# Patient Record
Sex: Female | Born: 1965 | Race: White | Hispanic: No | Marital: Single | Smoking: Former smoker
Health system: Southern US, Community
[De-identification: ages and names within clinical notes are randomized; demographics above are authoritative.]

## PROBLEM LIST (undated history)

## (undated) DIAGNOSIS — F419 Anxiety disorder, unspecified: Secondary | ICD-10-CM

## (undated) DIAGNOSIS — M1990 Unspecified osteoarthritis, unspecified site: Secondary | ICD-10-CM

## (undated) DIAGNOSIS — G4730 Sleep apnea, unspecified: Secondary | ICD-10-CM

## (undated) DIAGNOSIS — J329 Chronic sinusitis, unspecified: Secondary | ICD-10-CM

## (undated) DIAGNOSIS — K589 Irritable bowel syndrome without diarrhea: Secondary | ICD-10-CM

## (undated) DIAGNOSIS — J45909 Unspecified asthma, uncomplicated: Secondary | ICD-10-CM

## (undated) DIAGNOSIS — E785 Hyperlipidemia, unspecified: Secondary | ICD-10-CM

## (undated) DIAGNOSIS — J309 Allergic rhinitis, unspecified: Secondary | ICD-10-CM

## (undated) DIAGNOSIS — R42 Dizziness and giddiness: Secondary | ICD-10-CM

## (undated) HISTORY — PX: LAPAROSCOPIC HYSTERECTOMY: SHX1926

## (undated) HISTORY — PX: SINOSCOPY: SHX187

## (undated) HISTORY — DX: Unspecified osteoarthritis, unspecified site: M19.90

## (undated) HISTORY — DX: Anxiety disorder, unspecified: F41.9

## (undated) HISTORY — DX: Chronic sinusitis, unspecified: J32.9

## (undated) HISTORY — DX: Hyperlipidemia, unspecified: E78.5

## (undated) HISTORY — DX: Dizziness and giddiness: R42

## (undated) HISTORY — DX: Unspecified asthma, uncomplicated: J45.909

## (undated) HISTORY — DX: Irritable bowel syndrome, unspecified: K58.9

## (undated) HISTORY — DX: Sleep apnea, unspecified: G47.30

## (undated) HISTORY — DX: Allergic rhinitis, unspecified: J30.9

---

## 2012-06-03 DEATH — deceased

## 2016-01-22 HISTORY — PX: COLONOSCOPY: SHX174

## 2016-11-04 ENCOUNTER — Other Ambulatory Visit (HOSPITAL_COMMUNITY): Payer: Self-pay | Admitting: Physician Assistant

## 2016-11-04 DIAGNOSIS — S46002A Unspecified injury of muscle(s) and tendon(s) of the rotator cuff of left shoulder, initial encounter: Secondary | ICD-10-CM

## 2017-08-01 ENCOUNTER — Ambulatory Visit (INDEPENDENT_AMBULATORY_CARE_PROVIDER_SITE_OTHER): Payer: BLUE CROSS/BLUE SHIELD | Admitting: Allergy

## 2017-08-01 ENCOUNTER — Encounter: Payer: Self-pay | Admitting: Allergy

## 2017-08-01 VITALS — BP 108/60 | HR 88 | Temp 98.2°F | Resp 18 | Ht 63.5 in | Wt 149.4 lb

## 2017-08-01 DIAGNOSIS — J4540 Moderate persistent asthma, uncomplicated: Secondary | ICD-10-CM | POA: Diagnosis not present

## 2017-08-01 DIAGNOSIS — J3089 Other allergic rhinitis: Secondary | ICD-10-CM | POA: Diagnosis not present

## 2017-08-01 MED ORDER — MONTELUKAST SODIUM 10 MG PO TABS: ORAL_TABLET | ORAL | 5 refills | Status: DC

## 2017-08-01 NOTE — Progress Notes (Signed)
New Patient Note  RE: Holly Deleon MRN: [id number] DOB: 1965-10-03 Date of Office Visit: 08/01/2017  Referring provider: Cletis Athens, MD Primary care provider: Angelina Sheriff, MD  Chief Complaint: sinus symptoms  History of present illness: Holly Deleon is a 52 y.o. female presenting today for consultation for allergies.   She states she has been having allergy/sinus symptoms for years.  She states 6 months (spring/summer/fall) out of the year symptoms are a lot worse and 2 of the 6 where she will have hoarseness.  Her typical symptoms include nasal congestion, dry cough mostly at night impacting sleep onset and ear fullness with vertigo.   She sees Dr. Gaylyn Deleon, local ENT, due to history of nasal and endoscopic sinus surgery on 11/22/2012.  She states Dr. Gaylyn Deleon told her her sinuses are currently clear.  He recommend she use a nasal steroid spray.  She is using nasacort 1 spray each nostril daily and has been on for years and does not feel it is helping her symptoms.  She is currently on loratadine and states will change yearly (between this, zyrtec and allegra).  She has not yet tried xyzal.  She also has never taken singulair.    For her vertigo she does take meclizine as needed.     She works as a Holly Deleon and states she can usually walk in a home and smell the mold if there is mold problem in the home.    She does not have a history of asthma or previous diagnosis.  However she was prescribed a proair inhaler about 6 months ago.  She states she did have an albuterol inhaler several years ago as well during an illness.  She states she has been having more tightness in her chest and having SOB.  The proair does help relieve symptoms.  She states with the humidity has been using proair almost daily.   She denies nighttime awakenings and denies any ED/UC visits for respiratory symptoms.  She has had oral and injectable steroids before but for management of sinusitis.    No history of eczema or  food allergy.    She does report developing itchy rash with use of certain soaps and detergents which she knows to avoid.   Review of systems: Review of Systems  Constitutional: Negative for chills, fever and malaise/fatigue.  HENT: Positive for congestion, ear pain and sinus pain. Negative for ear discharge, hearing loss, nosebleeds, sore throat and tinnitus.   Eyes: Negative for pain, discharge and redness.  Respiratory: Positive for cough and shortness of breath. Negative for hemoptysis, sputum production and wheezing.   Cardiovascular: Negative for chest pain.  Gastrointestinal: Negative for abdominal pain, constipation, diarrhea, heartburn, nausea and vomiting.  Musculoskeletal: Negative for joint pain.  Skin: Negative for itching and rash.  Neurological: Positive for dizziness. Negative for headaches.    All other systems negative unless noted above in HPI  Past medical history: Past Medical History:  Diagnosis Date  . IBS (irritable bowel syndrome)   . Vertigo     Past surgical history: Past Surgical History:  Procedure Laterality Date  . LAPAROSCOPIC HYSTERECTOMY    . SINOSCOPY      Family history:  Family History  Problem Relation Age of Onset  . Allergic rhinitis Mother   . High Cholesterol Father   . Colon cancer Father   . Heart attack Maternal Grandfather   . Allergic rhinitis Son     Social history: Lives in a home without  carpeting with electric heating and central cooling.  No pets in the home.  No concern for water damage, mildew or roaches in the home.  She is a Holly Deleon.   Tobacco Use  . Smoking status: Current Every Day Smoker    Packs/day: 1.00    Types: Cigarettes  . Smokeless tobacco: Never Used    Medication List: Allergies as of 08/01/2017      Reactions   Penicillins Other (See Comments)   "My muscles relax" unknown      Medication List        Accurate as of 08/01/17  3:38 PM. Always use your most recent med list.            albuterol 108 (90 Base) MCG/ACT inhaler Commonly known as:  PROVENTIL HFA;VENTOLIN HFA INHALE 2 PUFF BY MOUTH 3 TIMES A DAY AS NEEDED   Biotin 1 MG Caps Take by mouth.   loratadine 10 MG tablet Commonly known as:  CLARITIN Take by mouth.   meclizine 25 MG tablet Commonly known as:  ANTIVERT TAKE 1 TABLET BY MOUTH EVERY EIGHT HOURS, AS NEEDED   NASACORT ALLERGY 24HR 55 MCG/ACT Aero nasal inhaler Generic drug:  triamcinolone Place 1 spray into the nose daily.       Known medication allergies: Allergies  Allergen Reactions  . Penicillins Other (See Comments)    "My muscles relax" unknown      Physical examination: Blood pressure 108/60, pulse 88, temperature 98.2 F (36.8 C), temperature source Oral, resp. rate 18, height 5' 3.5" (1.613 m), weight 149 lb 6.4 oz (67.8 kg).  General: Alert, interactive, in no acute distress. HEENT: PERRLA, TMs pearly gray, turbinates non-edematous without discharge, post-pharynx non erythematous. Neck: Supple without lymphadenopathy. Lungs: Clear to auscultation without wheezing, rhonchi or rales. {no increased work of breathing. CV: Normal S1, S2 without murmurs. Abdomen: Nondistended, nontender. Skin: Warm and dry, without lesions or rashes. Extremities:  No clubbing, cyanosis or edema. Neuro:   Grossly intact.  Diagnositics/Labs: Spirometry: FEV1: 2.44L 87%, FVC: 3.26L 92%, ratio consistent with nonobstructive pattern  Allergy testing: environmental allergy skin prick testing is positive to walnut tree.  Intradermal testing is borderline to mold mix 2, 3 and dog Allergy testing results were read and interpreted by provider, documented by clinical staff.   Assessment and plan:   Allergic rhinitis - environmental allergy skin testing today is positive to walnut tree and borderline to molds and dog.  Allergen avoidance measures provided.  - trial Xyzal 5mg  daily (this replaces loratadine) - for nasal congestion/drainage trial  use of Dymista 1 spray each nostril twice a day.  This is a combination nasal spray with Flonase + Astelin (nasal antihistamine).  This helps with both nasal congestion and drainage.     Hold your Nasacort while on Dymista - recommend starting singulair 10mg  daily - take at bedtime  Chest tightness/Shortness of breath - reactive airway disease - have access to albuterol inhaler 2 puffs every 4-6 hours as needed for cough/wheeze/shortness of breath/chest tightness.  May use 15-20 minutes prior to activity.   Monitor frequency of use.   - start singulair as above  Follow-up 4 months or sooner if needed  I appreciate the opportunity to take part in Holly Deleon's care. Please do not hesitate to contact me with questions.  Sincerely,   Prudy Feeler, MD Allergy/Immunology [facility]

## 2017-08-01 NOTE — Patient Instructions (Addendum)
Allergic rhinitis - environmental allergy skin testing today is positive to walnut tree and borderline to molds and dog.  Allergen avoidance measures provided.  - trial Xyzal 5mg  daily (this replaces loratadine) - for nasal congestion/drainage trial use of Dymista 1 spray each nostril twice a day.  This is a combination nasal spray with Flonase + Astelin (nasal antihistamine).  This helps with both nasal congestion and drainage.     Hold your Nasacort while on Dymista - recommend starting singulair 10mg  daily - take at bedtime  Chest tightness/Shortness of breath - reactive airway disease - have access to albuterol inhaler 2 puffs every 4-6 hours as needed for cough/wheeze/shortness of breath/chest tightness.  May use 15-20 minutes prior to activity.   Monitor frequency of use.   - start singulair as above   Follow-up 4 months or sooner if needed

## 2018-02-08 DIAGNOSIS — J329 Chronic sinusitis, unspecified: Secondary | ICD-10-CM | POA: Insufficient documentation

## 2018-02-08 DIAGNOSIS — R87619 Unspecified abnormal cytological findings in specimens from cervix uteri: Secondary | ICD-10-CM

## 2018-02-08 DIAGNOSIS — K589 Irritable bowel syndrome without diarrhea: Secondary | ICD-10-CM

## 2018-02-08 DIAGNOSIS — R42 Dizziness and giddiness: Secondary | ICD-10-CM | POA: Insufficient documentation

## 2018-02-08 HISTORY — DX: Irritable bowel syndrome, unspecified: K58.9

## 2018-02-08 HISTORY — DX: Unspecified abnormal cytological findings in specimens from cervix uteri: R87.619

## 2018-02-19 ENCOUNTER — Other Ambulatory Visit: Payer: Self-pay | Admitting: Obstetrics and Gynecology

## 2018-02-19 DIAGNOSIS — R928 Other abnormal and inconclusive findings on diagnostic imaging of breast: Secondary | ICD-10-CM

## 2018-02-23 ENCOUNTER — Ambulatory Visit
Admission: RE | Admit: 2018-02-23 | Discharge: 2018-02-23 | Disposition: A | Discharge status: Home / Self Care | Payer: No Typology Code available for payment source | Source: Ambulatory Visit | Attending: Obstetrics and Gynecology | Admitting: Obstetrics and Gynecology

## 2018-02-23 DIAGNOSIS — R928 Other abnormal and inconclusive findings on diagnostic imaging of breast: Secondary | ICD-10-CM

## 2019-01-27 ENCOUNTER — Other Ambulatory Visit: Payer: Self-pay

## 2019-01-27 ENCOUNTER — Emergency Department (HOSPITAL_COMMUNITY)
Admission: EM | Admit: 2019-01-27 | Discharge: 2019-01-27 | Disposition: A | Discharge status: Home / Self Care | Payer: No Typology Code available for payment source | Attending: Emergency Medicine | Admitting: Emergency Medicine

## 2019-01-27 ENCOUNTER — Encounter (HOSPITAL_COMMUNITY): Payer: Self-pay | Admitting: Emergency Medicine

## 2019-01-27 DIAGNOSIS — R55 Syncope and collapse: Secondary | ICD-10-CM | POA: Insufficient documentation

## 2019-01-27 DIAGNOSIS — M545 Low back pain, unspecified: Secondary | ICD-10-CM

## 2019-01-27 DIAGNOSIS — Z79899 Other long term (current) drug therapy: Secondary | ICD-10-CM | POA: Insufficient documentation

## 2019-01-27 DIAGNOSIS — F17210 Nicotine dependence, cigarettes, uncomplicated: Secondary | ICD-10-CM | POA: Diagnosis not present

## 2019-01-27 LAB — URINALYSIS, ROUTINE W REFLEX MICROSCOPIC
Bacteria, UA: NONE SEEN
Bilirubin Urine: NEGATIVE
Glucose, UA: NEGATIVE mg/dL
Ketones, ur: NEGATIVE mg/dL
Leukocytes,Ua: NEGATIVE
Nitrite: NEGATIVE
Protein, ur: NEGATIVE mg/dL
Specific Gravity, Urine: 1.008 (ref 1.005–1.030)
pH: 6 (ref 5.0–8.0)

## 2019-01-27 LAB — BASIC METABOLIC PANEL
Anion gap: 9 (ref 5–15)
BUN: 23 mg/dL — ABNORMAL HIGH (ref 6–20)
CO2: 25 mmol/L (ref 22–32)
Calcium: 9.1 mg/dL (ref 8.9–10.3)
Chloride: 104 mmol/L (ref 98–111)
Creatinine, Ser: 0.66 mg/dL (ref 0.44–1.00)
GFR calc Af Amer: >60 mL/min (ref >60)
GFR calc non Af Amer: >60 mL/min (ref >60)
Glucose, Bld: 104 mg/dL — ABNORMAL HIGH (ref 70–99)
Potassium: 4.3 mmol/L (ref 3.5–5.1)
Sodium: 138 mmol/L (ref 135–145)

## 2019-01-27 LAB — CBC
HCT: 38 % (ref 36.0–46.0)
Hemoglobin: 12.4 g/dL (ref 12.0–15.0)
MCH: 31.2 pg (ref 26.0–34.0)
MCHC: 32.6 g/dL (ref 30.0–36.0)
MCV: 95.5 fL (ref 80.0–100.0)
Platelets: 266 10*3/uL (ref 150–400)
RBC: 3.98 MIL/uL (ref 3.87–5.11)
RDW: 11.9 % (ref 11.5–15.5)
WBC: 10 10*3/uL (ref 4.0–10.5)
nRBC: 0 % (ref 0.0–0.2)

## 2019-01-27 LAB — CBG MONITORING, ED: Glucose-Capillary: 108 mg/dL — ABNORMAL HIGH (ref 70–99)

## 2019-01-27 MED ORDER — SODIUM CHLORIDE 0.9% FLUSH
3.0000 mL | Freq: Once | INTRAVENOUS | Status: AC
  Administered 2019-01-27: 3 mL via INTRAVENOUS

## 2019-01-27 MED ORDER — IBUPROFEN 800 MG PO TABS
800.0000 mg | ORAL_TABLET | Freq: Once | ORAL | Status: AC
  Administered 2019-01-27: 800 mg via ORAL
  Filled 2019-01-27: qty 1

## 2019-01-27 MED ORDER — SODIUM CHLORIDE 0.9 % IV BOLUS (SEPSIS)
1000.0000 mL | Freq: Once | INTRAVENOUS | Status: AC
  Administered 2019-01-27: 1000 mL via INTRAVENOUS

## 2019-01-27 MED ORDER — LIDOCAINE 5 % EX PTCH: 1.0000 | MEDICATED_PATCH | CUTANEOUS | 0 refills | Status: DC

## 2019-01-27 NOTE — ED Provider Notes (Signed)
TIME SEEN: 4:28 AM  CHIEF COMPLAINT: Syncope  HPI: Patient is a 54 year old female with history of IBS who presents to the emergency department syncopal event.  States that she got up from bedroom to go into the kitchen to get water and on the way back suddenly felt very lightheaded and passed out.  No preceding chest pain, shortness of breath.  No recent vomiting or diarrhea.  No bloody stools or melena.  No numbness or focal weakness.  Has had intermittent right lower back pain that will wrap around to the right lower quadrant over the past few days.  No injury to the back.  Pain improves with ibuprofen.  States she did not have severe pain that caused her to pass out.  ROS: See HPI Constitutional: no fever  Eyes: no drainage  ENT: no runny nose   Cardiovascular:  no chest pain  Resp: no SOB  GI: no vomiting GU: no dysuria Integumentary: no rash  Allergy: no hives  Musculoskeletal: no leg swelling  Neurological: no slurred speech ROS otherwise negative  PAST MEDICAL HISTORY/PAST SURGICAL HISTORY:  Past Medical History:  Diagnosis Date  . IBS (irritable bowel syndrome)   . Vertigo     MEDICATIONS:  Prior to Admission medications   Medication Sig Start Date End Date Taking? Authorizing Provider  albuterol (PROVENTIL HFA;VENTOLIN HFA) 108 (90 Base) MCG/ACT inhaler INHALE 2 PUFF BY MOUTH 3 TIMES A DAY AS NEEDED 06/14/17   [provider]  Biotin 1 MG CAPS Take by mouth.    [provider]  loratadine (CLARITIN) 10 MG tablet Take by mouth.    [provider]  meclizine (ANTIVERT) 25 MG tablet TAKE 1 TABLET BY MOUTH EVERY EIGHT HOURS, AS NEEDED 06/08/17   [provider]  montelukast (SINGULAIR) 10 MG tablet Take one tablet once daily as directed 08/01/17   Kennith Gain, MD  triamcinolone (NASACORT ALLERGY 24HR) 55 MCG/ACT AERO nasal inhaler Place 1 spray into the nose daily.    [provider]    ALLERGIES:  Allergies   Allergen Reactions  . Penicillins Other (See Comments)    "My muscles relax" unknown     SOCIAL HISTORY:  Social History   Tobacco Use  . Smoking status: Current Every Day Smoker    Packs/day: 1.00    Types: Cigarettes  . Smokeless tobacco: Never Used  Substance Use Topics  . Alcohol use: Not on file    FAMILY HISTORY: Family History  Problem Relation Age of Onset  . Allergic rhinitis Mother   . High Cholesterol Father   . Colon cancer Father   . Heart attack Maternal Grandfather   . Allergic rhinitis Son     EXAM: BP 118/88 (BP Location: Right Arm)   Pulse 82   Temp 98.9 F (37.2 C) (Oral)   Resp 17   Ht 5\' 3"  (1.6 m)   Wt 62.1 kg   SpO2 98%   BMI 24.27 kg/m  CONSTITUTIONAL: Alert and oriented and responds appropriately to questions. Well-appearing; well-nourished HEAD: Normocephalic, atraumatic EYES: Conjunctivae clear, pupils appear equal, EOM appear intact ENT: normal nose; moist mucous membranes NECK: Supple, normal ROM, no midline spinal tenderness or step-off or deformity CARD: RRR; S1 and S2 appreciated; no murmurs, no clicks, no rubs, no gallops RESP: Normal chest excursion without splinting or tachypnea; breath sounds clear and equal bilaterally; no wheezes, no rhonchi, no rales, no hypoxia or respiratory distress, speaking full sentences ABD/GI: Normal bowel sounds; non-distended; soft, non-tender,  no rebound, no guarding, no peritoneal signs, no hepatosplenomegaly, no tenderness at McBurney's point, no pelvic tenderness BACK:  The back appears normal, mildly tender over the right lumbar paraspinal muscles, no midline spinal tenderness or step-off or deformity, no redness or warmth, no ecchymosis or swelling EXT: Normal ROM in all joints; no deformity noted, no edema; no cyanosis SKIN: Normal color for age and race; warm; no rash on exposed skin NEURO: Moves all extremities equally, normal speech, normal sensation diffusely PSYCH: The patient's mood  and manner are appropriate.   MEDICAL DECISION MAKING: Patient here after syncopal event.  States she had just gotten up to go to the kitchen to get water and on the way back felt lightheaded and passed out.  No other preceding symptoms.  Neurologically intact.  EKG shows no ischemia, arrhythmia or interval abnormality.  Will check hemoglobin, electrolytes today.  She is concerned about this back pain.  Seems to be musculoskeletal in nature as she states it is worse with movement and I can reproduce it.  Her abdominal exam is benign.  Doubt AAA, dissection, ovarian torsion, appendicitis.  Will check urinalysis but low suspicion for UTI, pyelonephritis or kidney stone.  She declines any pain medicine at this time.  Will check orthostatic vital signs as well.  ED PROGRESS: Patient does have blood pressures in the 80s here.  She is unsure what her blood pressure normally runs.  States she does drink several cups of coffee a day and it was not drinking much water yesterday.  Does not appear dehydrated on exam and has no ketones in her urine but will hydrate aggressively.  Hemoglobin normal.  Electrolytes normal.  Urine shows no sign of infection.  She has no infectious symptoms and is afebrile.  No leukocytosis.  Doubt sepsis.  She is extremely well-appearing here.  Will reassess after hydration.  Discussed with patient that I suspect this is the reason she had a syncopal event today.   Went to reassess patient and she reports she is feeling better.  Have evaluated her blood pressure cuff and she has a cuff on that is too large for her arm.  Switched her to a smaller cuff and now blood pressures are improved and appear near her baseline last year.  She is concerned about this back pain.  States it is worse with walking, movement and palpation.  Discussed with her that I feel this is musculoskeletal in nature.  She declines any other medications other than ibuprofen.  No midline tenderness.  No injury to suggest  fracture.  No numbness, weakness, fever, bowel or bladder incontinence.  Doubt cauda equina, epidural abscess or hematoma, discitis or osteomyelitis, transverse myelitis.  I do not feel she needs emergent imaging.  She has follow-up with her PCP for this next week.  Recommended she continue ibuprofen, alternate heat and ice as needed.  We will also provide with prescription of Lidoderm patches.   At this time, I do not feel there is any life-threatening condition present. I have reviewed, interpreted and discussed all results (EKG, imaging, lab, urine as appropriate) and exam findings with patient/family. I have reviewed nursing notes and appropriate previous records.  I feel the patient is safe to be discharged home without further emergent workup and can continue workup as an outpatient as needed. Discussed usual and customary return precautions. Patient/family verbalize understanding and are comfortable with this plan.  Outpatient follow-up has been provided as needed. All questions have been answered.  EKG Interpretation  Date/Time:  Sunday January 27 2019 03:56:31 EST Ventricular Rate:  75 PR Interval:    QRS Duration: 82 QT Interval:  386 QTC Calculation: 432 R Axis:   82 Text Interpretation: Sinus rhythm Borderline repol abnrm, anterior leads No old tracing to compare Confirmed by Denelda Akerley, Cyril Mourning [phone]) on 01/27/2019 3:58:59 AM         Smith Holly Deleon was evaluated in Emergency Department on 01/27/2019 for the symptoms described in the history of present illness. She was evaluated in the context of the global COVID-19 pandemic, which necessitated consideration that the patient might be at risk for infection with the SARS-CoV-2 virus that causes COVID-19. Institutional protocols and algorithms that pertain to the evaluation of patients at risk for COVID-19 are in a state of rapid change based on information released by regulatory bodies including the CDC and federal and state organizations.  These policies and algorithms were followed during the patient's care in the ED.  Patient was seen wearing N95, face shield, gloves.      Linah Klapper, Delice Bison, DO 01/27/19 [id number]

## 2019-01-27 NOTE — Discharge Instructions (Signed)
You may alternate Tylenol 1000 mg every 6 hours as needed for pain and Ibuprofen 800 mg every 8 hours as needed for pain.  Please take Ibuprofen with food.  Do not take more than 4000 mg of Tylenol (acetaminophen) in a 24 hour period.  I recommend that you increase your fluid intake and decrease her caffeine intake.  I suspect this may have contributed to your episode of passing out last night.  Your EKG, labs, urine were reassuring.  Your back pain seems to be musculoskeletal in nature.  You may alternate Tylenol and Motrin for pain.  I do not feel you have an emergent process present today and can follow-up with your primary care physician for this.  I have prescribed Lidoderm patches which may help with your pain as well.

## 2019-01-27 NOTE — ED Notes (Signed)
Pt given water to drink for when she wakes up.

## 2019-01-27 NOTE — ED Triage Notes (Signed)
Pt reports having pain in right flank but had episode of syncope that pt doesn't remember. Pt currently alert and oriented x4 at this time.

## 2019-01-27 NOTE — ED Notes (Signed)
Ambulatory to bathroom.

## 2019-02-01 ENCOUNTER — Encounter: Payer: Self-pay | Admitting: Cardiology

## 2019-02-01 ENCOUNTER — Ambulatory Visit (INDEPENDENT_AMBULATORY_CARE_PROVIDER_SITE_OTHER): Payer: No Typology Code available for payment source | Admitting: Cardiology

## 2019-02-01 ENCOUNTER — Ambulatory Visit (INDEPENDENT_AMBULATORY_CARE_PROVIDER_SITE_OTHER): Payer: No Typology Code available for payment source

## 2019-02-01 ENCOUNTER — Other Ambulatory Visit: Payer: Self-pay

## 2019-02-01 VITALS — BP 110/80 | Ht 63.0 in | Wt 139.0 lb

## 2019-02-01 DIAGNOSIS — R55 Syncope and collapse: Secondary | ICD-10-CM

## 2019-02-01 DIAGNOSIS — R002 Palpitations: Secondary | ICD-10-CM

## 2019-02-01 DIAGNOSIS — Z720 Tobacco use: Secondary | ICD-10-CM

## 2019-02-01 HISTORY — DX: Palpitations: R00.2

## 2019-02-01 HISTORY — DX: Syncope and collapse: R55

## 2019-02-01 HISTORY — DX: Tobacco use: Z72.0

## 2019-02-01 NOTE — Progress Notes (Signed)
Cardiology Office Note:    Date:  02/01/2019   ID:  Holly Deleon, DOB [birth year], MRN [id number]  PCP:  Angelina Sheriff, MD  Cardiologist:  No primary care provider on file.  Electrophysiologist:  None   Referring MD: Angelina Sheriff, MD   Chief Complaint  Patient presents with  . Loss of Consciousness    History of Present Illness:    Holly Deleon is a 54 y.o. female with a hx of IBS, vertigo who presents today to be evaluated after a syncope episode.  The patient tells me that on January 24 she was at home by herself woke up in the middle the night to go to bathroom.  After going to the bathroom she went to she went into her kitchen to get some water she did drink a sip of water while going back to her bedroom she fell a little lightheaded and with a few seconds she feels she may have passed out.  She is not sure how long she was out for but she thinks it was a little over an hour.  When she awakened she checked her cell she felt that everything was right she is worried that she may have had a stroke but when she was able to hear herself talking move her muscles around.  She called her brother who took her to the DC department.  In the ED she was worked up and was told that everything was fine.  In addition she complains of intermittent palpitations but she tells me that she has been experiencing abrupt onset of fast heartbeat last a few seconds prior to resolution.  She denies any chest pain, orders of breath, nausea, vomiting.    Past Medical History:  Diagnosis Date  . Chronic sinusitis   . IBS (irritable bowel syndrome)   . Vertigo     Past Surgical History:  Procedure Laterality Date  . LAPAROSCOPIC HYSTERECTOMY    . SINOSCOPY      Current Medications: Current Meds  Medication Sig  . albuterol (PROVENTIL HFA;VENTOLIN HFA) 108 (90 Base) MCG/ACT inhaler Inhale 2 puffs into the lungs every 4 (four) hours as needed for wheezing.   . Biotin 1 MG CAPS Take 1 mg by  mouth daily.   Marland Kitchen ibuprofen (ADVIL) 200 MG tablet Take 800 mg by mouth every 6 (six) hours as needed for moderate pain.  Marland Kitchen lidocaine (LIDODERM) 5 % Place 1 patch onto the skin daily. Remove & Discard patch within 12 hours or as directed by MD  . meclizine (ANTIVERT) 12.5 MG tablet Take 12.5 mg by mouth 3 (three) times daily as needed for dizziness.  . montelukast (SINGULAIR) 10 MG tablet Take 10 mg by mouth at bedtime.  . Multiple Vitamins-Minerals (EQ MULTIVITAMINS ADULT GUMMY PO) Take 2 tablets by mouth daily.  Marland Kitchen triamcinolone (NASACORT ALLERGY 24HR) 55 MCG/ACT AERO nasal inhaler Place 1 spray into the nose daily as needed. Congestion     Allergies:   Penicillins   Social History   Socioeconomic History  . Marital status: Married    Spouse name: Not on file  . Number of children: Not on file  . Years of education: Not on file  . Highest education level: Not on file  Occupational History  . Not on file  Tobacco Use  . Smoking status: Current Every Day Smoker    Packs/day: 1.00    Types: Cigarettes  . Smokeless tobacco: Never Used  Substance and Sexual Activity  .  Alcohol use: Not on file  . Drug use: Not on file  . Sexual activity: Not on file  Other Topics Concern  . Not on file  Social History Narrative  . Not on file   Social Determinants of Health   Financial Resource Strain:   . Difficulty of Paying Living Expenses: Not on file  Food Insecurity:   . Worried About Charity fundraiser in the Last Year: Not on file  . Ran Out of Food in the Last Year: Not on file  Transportation Needs:   . Lack of Transportation (Medical): Not on file  . Lack of Transportation (Non-Medical): Not on file  Physical Activity:   . Days of Exercise per Week: Not on file  . Minutes of Exercise per Session: Not on file  Stress:   . Feeling of Stress : Not on file  Social Connections:   . Frequency of Communication with Friends and Family: Not on file  . Frequency of Social Gatherings  with Friends and Family: Not on file  . Attends Religious Services: Not on file  . Active Member of Clubs or Organizations: Not on file  . Attends Archivist Meetings: Not on file  . Marital Status: Not on file     Family History: The patient's family history includes Allergic rhinitis in her mother and son; Arthritis in her mother; Colon cancer in her father; Heart attack in her maternal grandfather and maternal uncle; High Cholesterol in her father; Stroke in her maternal aunt and maternal grandmother.  ROS:   Review of Systems  Constitution: Negative for decreased appetite, fever and weight gain.  HENT: Negative for congestion, ear discharge, hoarse voice and sore throat.   Eyes: Negative for discharge, redness, vision loss in right eye and visual halos.  Cardiovascular: Reports syncope and palpitations.  Negative for chest pain, dyspnea on exertion, leg swelling, orthopnea. Respiratory: Negative for cough, hemoptysis, shortness of breath and snoring.   Endocrine: Negative for heat intolerance and polyphagia.  Hematologic/Lymphatic: Negative for bleeding problem. Does not bruise/bleed easily.  Skin: Negative for flushing, nail changes, rash and suspicious lesions.  Musculoskeletal: Negative for arthritis, joint pain, muscle cramps, myalgias, neck pain and stiffness.  Gastrointestinal: Negative for abdominal pain, bowel incontinence, diarrhea and excessive appetite.  Genitourinary: Negative for decreased libido, genital sores and incomplete emptying.  Neurological: Negative for brief paralysis, focal weakness, headaches and loss of balance.  Psychiatric/Behavioral: Negative for altered mental status, depression and suicidal ideas.  Allergic/Immunologic: Negative for HIV exposure and persistent infections.    EKGs/Labs/Other Studies Reviewed:    The following studies were reviewed today:   EKG:  The ekg ordered today demonstrates sinus rhythm, heart rate 74 bpm with  nonspecific ST changes, compared to prior EKG done on January 27, 2019 no significant change.   Recent Labs: 01/27/2019: BUN 23; Creatinine, Ser 0.66; Hemoglobin 12.4; Platelets 266; Potassium 4.3; Sodium 138  Recent Lipid Panel No results found for: CHOL, TRIG, HDL, CHOLHDL, VLDL, LDLCALC, LDLDIRECT  Physical Exam:    VS:  BP 110/80 (BP Location: Left Arm, Patient Position: Sitting, Cuff Size: Normal)   Ht 5\' 3"  (1.6 m)   Wt 139 lb (63 kg)   BMI 24.62 kg/m     Wt Readings from Last 3 Encounters:  02/01/19 139 lb (63 kg)  01/27/19 137 lb (62.1 kg)  08/01/17 149 lb 6.4 oz (67.8 kg)     GEN: Well nourished, well developed in no acute distress HEENT: Normal NECK:  No JVD; No carotid bruits LYMPHATICS: No lymphadenopathy CARDIAC: S1S2 noted,RRR, no murmurs, rubs, gallops RESPIRATORY:  Clear to auscultation without rales, wheezing or rhonchi  ABDOMEN: Soft, non-tender, non-distended, +bowel sounds, no guarding. EXTREMITIES: No edema, No cyanosis, no clubbing MUSCULOSKELETAL:  No deformity  SKIN: Warm and dry NEUROLOGIC:  Alert and oriented x 3, non-focal PSYCHIATRIC:  Normal affect, good insight  ASSESSMENT:    1. Palpitations   2. Syncope and collapse    PLAN:    1.  I would like to rule out a cardiovascular etiology of this syncope and palpitations, therefore at this time I would like to placed a zio patch for 7 days. In additon a transthoracic echocardiogram will be ordered to assess LV/RV function and any structural abnormalities. Once these testing have been performed amd reviewed further reccomendations will be made. For now, I do reccomend that the patient goes to the nearest ED if  symptoms recur.  I did discuss the [place] DMV medical guidelines for driving: "it is prudent to recommend that all persons should be free of syncopal episodes for at least six months to be granted the driving privilege." ([place], [place], Medical Review Branch, Engineer, site, Division of [employer] of Transportation, July 2004)  2.  Tobacco use-I discussed the detrimental effects about smoking at this time the patient is not ready to quit she tells me.  The patient was counseled on tobacco cessation today for 5 minutes.  Counseling included reviewing the risks of smoking tobacco products, how it impacts the patient's current medical diagnoses and different strategies for quitting.  Pharmacotherapy to aid in tobacco cessation was not prescribed today. The patient coordinate with  primary care provider.  The patient was also advised to call   1-800-QUIT-NOW 813-662-8333) for additional help with quitting smoking.  The patient is in agreement with the above plan. The patient left the office in stable condition.  The patient will follow up in 3 months or sooner if needed   Medication Adjustments/Labs and Tests Ordered: Current medicines are reviewed at length with the patient today.  Concerns regarding medicines are outlined above.  No orders of the defined types were placed in this encounter.  No orders of the defined types were placed in this encounter.   There are no Patient Instructions on file for this visit.   Adopting a Healthy Lifestyle.  Know what a healthy weight is for you (roughly BMI <25) and aim to maintain this   Aim for 7+ servings of fruits and vegetables daily   65-80+ fluid ounces of water or unsweet tea for healthy kidneys   Limit to max 1 drink of alcohol per day; avoid smoking/tobacco   Limit animal fats in diet for cholesterol and heart health - choose grass fed whenever available   Avoid highly processed foods, and foods high in saturated/trans fats   Aim for low stress - take time to unwind and care for your mental health   Aim for 150 min of moderate intensity exercise weekly for heart health, and weights twice weekly for bone health   Aim for  7-9 hours of sleep daily   When it comes to diets, agreement about the perfect plan isnt easy to find, even among the experts. Experts at the [employer] developed an idea known as the Healthy Eating Plate. Just imagine a plate divided into logical, healthy portions.   The  emphasis is on diet quality:   Load up on vegetables and fruits - one-half of your plate: Aim for color and variety, and remember that potatoes dont count.   Go for whole grains - one-quarter of your plate: Whole wheat, barley, wheat berries, quinoa, oats, brown rice, and foods made with them. If you want pasta, go with whole wheat pasta.   Protein power - one-quarter of your plate: Fish, chicken, beans, and nuts are all healthy, versatile protein sources. Limit red meat.   The diet, however, does go beyond the plate, offering a few other suggestions.   Use healthy plant oils, such as olive, canola, soy, corn, sunflower and peanut. Check the labels, and avoid partially hydrogenated oil, which have unhealthy trans fats.   If youre thirsty, drink water. Coffee and tea are good in moderation, but skip sugary drinks and limit milk and dairy products to one or two daily servings.   The type of carbohydrate in the diet is more important than the amount. Some sources of carbohydrates, such as vegetables, fruits, whole grains, and beans-are healthier than others.   Finally, stay active  Signed, Berniece Salines, DO  02/01/2019 2:17 PM    [facility]

## 2019-02-01 NOTE — Addendum Note (Signed)
Addended by: Particia Nearing B on: 02/01/2019 02:26 PM   Modules accepted: Orders

## 2019-02-01 NOTE — Patient Instructions (Signed)
Medication Instructions:  Your physician recommends that you continue on your current medications as directed. Please refer to the Current Medication list given to you today.  *If you need a refill on your cardiac medications before your next appointment, please call your pharmacy*  Lab Work:  Your physician recommends that you return for lab work in: TODAY TSH  If you have labs (blood work) drawn today and your tests are completely normal, you will receive your results only by: Marland Kitchen MyChart Message (if you have MyChart) OR . A paper copy in the mail If you have any lab test that is abnormal or we need to change your treatment, we will call you to review the results.  Testing/Procedures: Your physician has requested that you have an echocardiogram. Echocardiography is a painless test that uses sound waves to create images of your heart. It provides your doctor with information about the size and shape of your heart and how well your heart's chambers and valves are working. This procedure takes approximately one hour. There are no restrictions for this procedure.  A zio monitor was ordered today. It will remain on for 7 days. You will then return monitor and event diary in provided box. It takes 1-2 weeks for report to be downloaded and returned to [place]. We will call you with the results. If monitor falls off or has orange flashing light, please call Zio for further instructions.     Follow-Up: At [facility], you and your health needs are our priority.  As part of our continuing mission to provide you with exceptional heart care, we have created designated Provider Care Teams.  These Care Teams include your primary Cardiologist (physician) and Advanced Practice Providers (APPs -  Physician Assistants and Nurse Practitioners) who all work together to provide you with the care you need, when you need it.  Your next appointment:   3 month(s)  The format for your next appointment:   In  Person  Provider:   Berniece Salines, DO  Other Instructions

## 2019-02-02 LAB — TSH: TSH: 1.45 u[IU]/mL (ref 0.450–4.500)

## 2019-02-04 ENCOUNTER — Encounter: Payer: Self-pay | Admitting: Gastroenterology

## 2019-02-04 ENCOUNTER — Telehealth: Payer: Self-pay | Admitting: *Deleted

## 2019-02-04 ENCOUNTER — Telehealth: Payer: Self-pay | Admitting: Gastroenterology

## 2019-02-04 NOTE — Telephone Encounter (Signed)
Telephone call to patient. Left message with lab results and to call with any questions.

## 2019-02-04 NOTE — Addendum Note (Signed)
Addended by: Particia Nearing B on: 02/04/2019 09:45 AM   Modules accepted: Orders

## 2019-02-04 NOTE — Telephone Encounter (Signed)
-----   Message from Berniece Salines, DO sent at 02/04/2019  9:15 AM EST ----- TSH is normal.

## 2019-02-04 NOTE — Telephone Encounter (Signed)
LM on vmail to call back to schedule recall colonoscopy

## 2019-02-06 ENCOUNTER — Telehealth: Payer: Self-pay | Admitting: *Deleted

## 2019-02-08 NOTE — Telephone Encounter (Signed)
Patient will notify us once she has cardiac clearance after up-coming echocardiogram in March

## 2019-02-18 ENCOUNTER — Encounter: Payer: No Typology Code available for payment source | Admitting: Gastroenterology

## 2019-02-21 ENCOUNTER — Telehealth: Payer: Self-pay | Admitting: *Deleted

## 2019-02-21 NOTE — Telephone Encounter (Signed)
Patient informed. Copy sent to PCP °

## 2019-02-21 NOTE — Telephone Encounter (Signed)
-----   Message from Berniece Salines, DO sent at 02/20/2019 11:13 PM EST ----- Normal study

## 2019-02-25 ENCOUNTER — Telehealth: Payer: Self-pay | Admitting: Cardiology

## 2019-02-25 NOTE — Telephone Encounter (Signed)
Per DR Tobb. We will be unable to write a jury note. Dr Harriet Masson recommended talking to her PCP. Patient informed and states she will.

## 2019-02-25 NOTE — Telephone Encounter (Signed)
Patient is calling requesting a letter excusing her from jury duty due to her heart condition. She states it is not until 03/18/19 - 03/28/19. Please advise

## 2019-03-04 ENCOUNTER — Other Ambulatory Visit: Payer: Self-pay | Admitting: Obstetrics and Gynecology

## 2019-03-04 DIAGNOSIS — R928 Other abnormal and inconclusive findings on diagnostic imaging of breast: Secondary | ICD-10-CM

## 2019-03-06 ENCOUNTER — Ambulatory Visit
Admission: RE | Admit: 2019-03-06 | Discharge: 2019-03-06 | Disposition: A | Discharge status: Home / Self Care | Payer: No Typology Code available for payment source | Source: Ambulatory Visit | Attending: Obstetrics and Gynecology | Admitting: Obstetrics and Gynecology

## 2019-03-06 ENCOUNTER — Other Ambulatory Visit: Payer: Self-pay | Admitting: Obstetrics and Gynecology

## 2019-03-06 ENCOUNTER — Other Ambulatory Visit: Payer: Self-pay

## 2019-03-06 DIAGNOSIS — R928 Other abnormal and inconclusive findings on diagnostic imaging of breast: Secondary | ICD-10-CM

## 2019-03-21 ENCOUNTER — Other Ambulatory Visit: Payer: Self-pay

## 2019-03-21 ENCOUNTER — Ambulatory Visit (INDEPENDENT_AMBULATORY_CARE_PROVIDER_SITE_OTHER): Payer: No Typology Code available for payment source

## 2019-03-21 DIAGNOSIS — R55 Syncope and collapse: Secondary | ICD-10-CM

## 2019-03-21 DIAGNOSIS — R002 Palpitations: Secondary | ICD-10-CM | POA: Diagnosis not present

## 2019-03-21 NOTE — Progress Notes (Unsigned)
Complete echocardiogram has been performed.  Jimmy Dylin Breeden RDCS, RVT 

## 2019-05-06 ENCOUNTER — Other Ambulatory Visit: Payer: Self-pay

## 2019-05-07 ENCOUNTER — Encounter: Payer: Self-pay | Admitting: Cardiology

## 2019-05-07 ENCOUNTER — Ambulatory Visit (INDEPENDENT_AMBULATORY_CARE_PROVIDER_SITE_OTHER): Payer: No Typology Code available for payment source | Admitting: Cardiology

## 2019-05-07 ENCOUNTER — Other Ambulatory Visit: Payer: Self-pay

## 2019-05-07 VITALS — BP 100/64 | HR 89 | Ht 63.0 in | Wt 140.0 lb

## 2019-05-07 DIAGNOSIS — R55 Syncope and collapse: Secondary | ICD-10-CM | POA: Diagnosis not present

## 2019-05-07 DIAGNOSIS — R002 Palpitations: Secondary | ICD-10-CM

## 2019-05-07 NOTE — Progress Notes (Signed)
Cardiology Office Note:    Date:  05/07/2019   ID:  Holly Deleon, DOB 09-22-1965, MRN [id number]  PCP:  Angelina Sheriff, MD  Cardiologist:  Berniece Salines, DO  Electrophysiologist:  None   Referring MD: Angelina Sheriff, MD   The patient is here for a follow up visit.  History of Present Illness:    Holly Deleon is a 54 y.o. female with a hx of IBS, vertigo presented on 04/01/2019 to be evaluated for palpitations and syncope episode.  The conclusion of visit I placed a ZIO monitor and the patient got a transthoracic echocardiogram. In the interim the patient was able to get this testing done there were all within normal.  Today she is here for follow-up visit she doing well she offers no complaints at this time.  She is looking forward to getting her colonoscopy.  Past Medical History:  Diagnosis Date  . Abnormal cervical Papanicolaou smear 02/08/2018  . Chronic sinusitis   . IBS (irritable bowel syndrome)   . Irritable bowel syndrome 02/08/2018  . Palpitations 02/01/2019  . Syncope and collapse 02/01/2019  . Tobacco use 02/01/2019  . Vertigo     Past Surgical History:  Procedure Laterality Date  . LAPAROSCOPIC HYSTERECTOMY    . SINOSCOPY      Current Medications: Current Meds  Medication Sig  . albuterol (PROVENTIL HFA;VENTOLIN HFA) 108 (90 Base) MCG/ACT inhaler Inhale 2 puffs into the lungs every 4 (four) hours as needed for wheezing.   . Biotin 1 MG CAPS Take 1 mg by mouth daily.   . cyclobenzaprine (FLEXERIL) 5 MG tablet Take 5 mg by mouth 2 (two) times daily as needed for pain.  Marland Kitchen dicyclomine (BENTYL) 10 MG capsule Take 1-2 capsules by mouth in the morning. And at bedtime as needed for abdominal pain  . estradiol (VIVELLE-DOT) 0.05 MG/24HR patch Place 0.25 mg onto the skin 2 (two) times a week.  Marland Kitchen ibuprofen (ADVIL) 200 MG tablet Take 800 mg by mouth every 6 (six) hours as needed for moderate pain.  . meclizine (ANTIVERT) 12.5 MG tablet Take 12.5 mg by mouth 3 (three)  times daily as needed for dizziness.  . montelukast (SINGULAIR) 10 MG tablet Take 10 mg by mouth at bedtime.  . Multiple Vitamins-Minerals (EQ MULTIVITAMINS ADULT GUMMY PO) Take 2 tablets by mouth daily.  . nitrofurantoin (MACRODANTIN) 100 MG capsule Take 100 mg by mouth 2 (two) times daily.  Marland Kitchen triamcinolone (NASACORT ALLERGY 24HR) 55 MCG/ACT AERO nasal inhaler Place 1 spray into the nose daily as needed. Congestion     Allergies:   Penicillins   Social History   Socioeconomic History  . Marital status: Single    Spouse name: Not on file  . Number of children: Not on file  . Years of education: Not on file  . Highest education level: Not on file  Occupational History  . Not on file  Tobacco Use  . Smoking status: Current Every Day Smoker    Packs/day: 1.00    Types: Cigarettes  . Smokeless tobacco: Never Used  Substance and Sexual Activity  . Alcohol use: Not on file  . Drug use: Not on file  . Sexual activity: Not on file  Other Topics Concern  . Not on file  Social History Narrative  . Not on file   Social Determinants of Health   Financial Resource Strain:   . Difficulty of Paying Living Expenses:   Food Insecurity:   . Worried About  Running Out of Food in the Last Year:   . [place] in the Last Year:   Transportation Needs:   . Lack of Transportation (Medical):   Marland Kitchen Lack of Transportation (Non-Medical):   Physical Activity:   . Days of Exercise per Week:   . Minutes of Exercise per Session:   Stress:   . Feeling of Stress :   Social Connections:   . Frequency of Communication with Friends and Family:   . Frequency of Social Gatherings with Friends and Family:   . Attends Religious Services:   . Active Member of Clubs or Organizations:   . Attends Archivist Meetings:   Marland Kitchen Marital Status:      Family History: The patient's family history includes Allergic rhinitis in her mother and son; Arthritis in her mother; Colon cancer in her father;  Heart attack in her maternal grandfather and maternal uncle; High Cholesterol in her father; Stroke in her maternal aunt and maternal grandmother.  ROS:   Review of Systems  Constitution: Negative for decreased appetite, fever and weight gain.  HENT: Negative for congestion, ear discharge, hoarse voice and sore throat.   Eyes: Negative for discharge, redness, vision loss in right eye and visual halos.  Cardiovascular: Negative for chest pain, dyspnea on exertion, leg swelling, orthopnea and palpitations.  Respiratory: Negative for cough, hemoptysis, shortness of breath and snoring.   Endocrine: Negative for heat intolerance and polyphagia.  Hematologic/Lymphatic: Negative for bleeding problem. Does not bruise/bleed easily.  Skin: Negative for flushing, nail changes, rash and suspicious lesions.  Musculoskeletal: Negative for arthritis, joint pain, muscle cramps, myalgias, neck pain and stiffness.  Gastrointestinal: Negative for abdominal pain, bowel incontinence, diarrhea and excessive appetite.  Genitourinary: Negative for decreased libido, genital sores and incomplete emptying.  Neurological: Negative for brief paralysis, focal weakness, headaches and loss of balance.  Psychiatric/Behavioral: Negative for altered mental status, depression and suicidal ideas.  Allergic/Immunologic: Negative for HIV exposure and persistent infections.    EKGs/Labs/Other Studies Reviewed:    The following studies were reviewed today:   EKG: None today  Zio monitor  The patient wore the monitor for 6 days 23 hours starting February 01, 2019. Indication: Syncope The minimum heart rate was 53 bpm, maximum heart rate was 118 bpm, and average heart rate was 80 bpm. Predominant underlying rhythm was Sinus Rhythm. Premature atrial complexes were rare, less than 1%. Premature Ventricular complexes rare, less than 1%. No ventricular tachycardia, pauses, No AV block, no supraventricular tachycardia and no  atrial fibrillation present. No patient triggered events noted. Conclusion: Normal/unremarkable study.  03/21/2019 IMPRESSIONS  1. Left ventricular ejection fraction, by estimation, is 60 to 65%. The left ventricle has normal function. The left ventricle has no regional wall motion abnormalities. Left ventricular diastolic parameters were normal.  2. Right ventricular systolic function is normal. The right ventricular size is normal.  3. The mitral valve is normal in structure. Trivial mitral valve regurgitation. No evidence of mitral stenosis.  4. The aortic valve is normal in structure. Aortic valve regurgitation is not visualized. No aortic stenosis is present.  5. The inferior vena cava is normal in size with greater than 50% respiratory variability, suggesting right atrial pressure of 3 mmHg.   Recent Labs: 01/27/2019: BUN 23; Creatinine, Ser 0.66; Hemoglobin 12.4; Platelets 266; Potassium 4.3; Sodium 138 02/01/2019: TSH 1.450  Recent Lipid Panel No results found for: CHOL, TRIG, HDL, CHOLHDL, VLDL, LDLCALC, LDLDIRECT  Physical Exam:    VS:  BP 100/64   Pulse 89   Ht 5\' 3"  (1.6 m)   Wt 140 lb (63.5 kg)   SpO2 95%   BMI 24.80 kg/m     Wt Readings from Last 3 Encounters:  05/07/19 140 lb (63.5 kg)  02/01/19 139 lb (63 kg)  01/27/19 137 lb (62.1 kg)     GEN: Well nourished, well developed in no acute distress HEENT: Normal NECK: No JVD; No carotid bruits LYMPHATICS: No lymphadenopathy CARDIAC: S1S2 noted,RRR, no murmurs, rubs, gallops RESPIRATORY:  Clear to auscultation without rales, wheezing or rhonchi  ABDOMEN: Soft, non-tender, non-distended, +bowel sounds, no guarding. EXTREMITIES: No edema, No cyanosis, no clubbing MUSCULOSKELETAL:  No deformity  SKIN: Warm and dry NEUROLOGIC:  Alert and oriented x 3, non-focal PSYCHIATRIC:  Normal affect, good insight  ASSESSMENT:    1. Palpitations   2. Syncope and collapse    PLAN:    I was able to review her testing  with her which included her normal ZIO monitor as well as her echocardiogram which does not show any valvular abnormalities.  I do believe her syncope may have been vasovagal.  I do not see any cardiac etiology here.    From a cardiovascular standpoint I think the patient can proceed with her colonoscopy.  The patient is in agreement with the above plan. The patient left the office in stable condition.  The patient will follow up in 1 year or sooner if needed.  Medication Adjustments/Labs and Tests Ordered: Current medicines are reviewed at length with the patient today.  Concerns regarding medicines are outlined above.  No orders of the defined types were placed in this encounter.  No orders of the defined types were placed in this encounter.   Patient Instructions  Medication Instructions:  Your physician recommends that you continue on your current medications as directed. Please refer to the Current Medication list given to you today.  *If you need a refill on your cardiac medications before your next appointment, please call your pharmacy*   Lab Work: NONE If you have labs (blood work) drawn today and your tests are completely normal, you will receive your results only by: Marland Kitchen MyChart Message (if you have MyChart) OR . A paper copy in the mail If you have any lab test that is abnormal or we need to change your treatment, we will call you to review the results.   Testing/Procedures: NONE   Follow-Up: At [facility], you and your health needs are our priority.  As part of our continuing mission to provide you with exceptional heart care, we have created designated Provider Care Teams.  These Care Teams include your primary Cardiologist (physician) and Advanced Practice Providers (APPs -  Physician Assistants and Nurse Practitioners) who all work together to provide you with the care you need, when you need it.  We recommend signing up for the patient portal called "MyChart".   Sign up information is provided on this After Visit Summary.  MyChart is used to connect with patients for Virtual Visits (Telemedicine).  Patients are able to view lab/test results, encounter notes, upcoming appointments, etc.  Non-urgent messages can be sent to your provider as well.   To learn more about what you can do with MyChart, go to [email or web address].    Your next appointment:   1 year(s) AS NEEDED  The format for your next appointment:   In Person  Provider:   Berniece Salines, DO       Adopting a  Healthy Lifestyle.  Know what a healthy weight is for you (roughly BMI <25) and aim to maintain this   Aim for 7+ servings of fruits and vegetables daily   65-80+ fluid ounces of water or unsweet tea for healthy kidneys   Limit to max 1 drink of alcohol per day; avoid smoking/tobacco   Limit animal fats in diet for cholesterol and heart health - choose grass fed whenever available   Avoid highly processed foods, and foods high in saturated/trans fats   Aim for low stress - take time to unwind and care for your mental health   Aim for 150 min of moderate intensity exercise weekly for heart health, and weights twice weekly for bone health   Aim for 7-9 hours of sleep daily   When it comes to diets, agreement about the perfect plan isnt easy to find, even among the experts. Experts at the [employer] developed an idea known as the Healthy Eating Plate. Just imagine a plate divided into logical, healthy portions.   The emphasis is on diet quality:   Load up on vegetables and fruits - one-half of your plate: Aim for color and variety, and remember that potatoes dont count.   Go for whole grains - one-quarter of your plate: Whole wheat, barley, wheat berries, quinoa, oats, brown rice, and foods made with them. If you want pasta, go with whole wheat pasta.   Protein power - one-quarter of your plate: Fish, chicken, beans, and nuts are all healthy,  versatile protein sources. Limit red meat.   The diet, however, does go beyond the plate, offering a few other suggestions.   Use healthy plant oils, such as olive, canola, soy, corn, sunflower and peanut. Check the labels, and avoid partially hydrogenated oil, which have unhealthy trans fats.   If youre thirsty, drink water. Coffee and tea are good in moderation, but skip sugary drinks and limit milk and dairy products to one or two daily servings.   The type of carbohydrate in the diet is more important than the amount. Some sources of carbohydrates, such as vegetables, fruits, whole grains, and beans-are healthier than others.   Finally, stay active  Signed, Berniece Salines, DO  05/07/2019 3:10 PM    [facility]

## 2019-05-07 NOTE — Patient Instructions (Signed)
Medication Instructions:  Your physician recommends that you continue on your current medications as directed. Please refer to the Current Medication list given to you today.  *If you need a refill on your cardiac medications before your next appointment, please call your pharmacy*   Lab Work: NONE If you have labs (blood work) drawn today and your tests are completely normal, you will receive your results only by: Marland Kitchen MyChart Message (if you have MyChart) OR . A paper copy in the mail If you have any lab test that is abnormal or we need to change your treatment, we will call you to review the results.   Testing/Procedures: NONE   Follow-Up: At [facility], you and your health needs are our priority.  As part of our continuing mission to provide you with exceptional heart care, we have created designated Provider Care Teams.  These Care Teams include your primary Cardiologist (physician) and Advanced Practice Providers (APPs -  Physician Assistants and Nurse Practitioners) who all work together to provide you with the care you need, when you need it.  We recommend signing up for the patient portal called "MyChart".  Sign up information is provided on this After Visit Summary.  MyChart is used to connect with patients for Virtual Visits (Telemedicine).  Patients are able to view lab/test results, encounter notes, upcoming appointments, etc.  Non-urgent messages can be sent to your provider as well.   To learn more about what you can do with MyChart, go to [email or web address].    Your next appointment:   1 year(s) AS NEEDED  The format for your next appointment:   In Person  Provider:   Berniece Salines, DO

## 2019-08-07 ENCOUNTER — Ambulatory Visit (INDEPENDENT_AMBULATORY_CARE_PROVIDER_SITE_OTHER): Payer: No Typology Code available for payment source | Admitting: Gastroenterology

## 2019-08-07 ENCOUNTER — Ambulatory Visit: Payer: No Typology Code available for payment source | Admitting: Gastroenterology

## 2019-08-07 ENCOUNTER — Encounter: Payer: Self-pay | Admitting: Gastroenterology

## 2019-08-07 VITALS — BP 100/66 | HR 83 | Ht 63.0 in | Wt 140.4 lb

## 2019-08-07 DIAGNOSIS — Z01818 Encounter for other preprocedural examination: Secondary | ICD-10-CM | POA: Diagnosis not present

## 2019-08-07 DIAGNOSIS — Z800 Family history of malignant neoplasm of digestive organs: Secondary | ICD-10-CM

## 2019-08-07 DIAGNOSIS — K582 Mixed irritable bowel syndrome: Secondary | ICD-10-CM

## 2019-08-07 DIAGNOSIS — Z860100 Personal history of colon polyps, unspecified: Secondary | ICD-10-CM

## 2019-08-07 NOTE — Progress Notes (Signed)
Chief Complaint: for colon  Referring Provider:  Angelina Sheriff, MD      ASSESSMENT AND PLAN;   #1. FH colon cancer (dad at age 54)  #2. H/O polyps (SSA on colon 01/2016)  #3. IBS with alt constipation/diarrhea. Neg random colonic biopsies 2018   Plan: - Proceed with colonoscopy with clenpiq. Discussed risks & benefits. (Risks including rare perforation req laparotomy, bleeding after bx/polypectomy req blood transfusion, rarely missing neoplasms, risks of anesthesia/sedation). Benefits outweigh the risks. Patient agrees to proceed. All the questions were answered. Consent forms given for review.    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  Abdominal bloating and mild abdominal discomfort during episodes which get better with dicyclomine as needed.  With positive family history of colon cancer-dad at in mid 60  No nausea, vomiting, heartburn, regurgitation, odynophagia or dysphagia.  No significant diarrhea or constipation. No melena or hematochezia. No unintentional weight loss. No abdominal pain currently.  Past colonoscopy -Colonoscopy ([facility]) 01/22/2016: Colonic polyps s/p polypectomy. Bx- SSA. Neg random colonic biopsies.  Otherwise normal to TI.  Repeat in 3 years.   Past Medical History:  Diagnosis Date   Abnormal cervical Papanicolaou smear 02/08/2018   Allergic rhinitis    Asthma    Chronic sinusitis    IBS (irritable bowel syndrome)    Irritable bowel syndrome 02/08/2018   Palpitations 02/01/2019   Sleep apnea    Syncope and collapse 02/01/2019   Tobacco use 02/01/2019   Vertigo     Past Surgical History:  Procedure Laterality Date   COLONOSCOPY  01/22/2016   Colon Polyp(s). Internal hemorrhoids   LAPAROSCOPIC HYSTERECTOMY     SINOSCOPY      Family History  Problem Relation Age of Onset   Allergic rhinitis Mother    Arthritis Mother    Irritable bowel  syndrome Mother    High Cholesterol Father    Colon cancer Father    Heart attack Maternal Grandfather    Allergic rhinitis Son    Stroke Maternal Grandmother    Irritable bowel syndrome Maternal Grandmother    Stroke Maternal Aunt    Heart attack Maternal Uncle    Irritable bowel syndrome Niece     Social History   Tobacco Use   Smoking status: Current Every Day Smoker    Packs/day: 1.00    Types: Cigarettes   Smokeless tobacco: Never Used  Vaping Use   Vaping Use: Never used  Substance Use Topics   Alcohol use: Yes    Comment: socially   Drug use: Not Currently    Current Outpatient Medications  Medication Sig Dispense Refill   albuterol (PROVENTIL HFA;VENTOLIN HFA) 108 (90 Base) MCG/ACT inhaler Inhale 2 puffs into the lungs every 4 (four) hours as needed for wheezing.   1   Biotin 1 MG CAPS Take 1 mg by mouth daily.      cyclobenzaprine (FLEXERIL) 5 MG tablet Take 5 mg by mouth 2 (two) times daily as needed for pain.     dicyclomine (BENTYL) 10 MG capsule Take 1-2 capsules by mouth in the morning. And at bedtime as needed for abdominal pain     meclizine (ANTIVERT) 12.5 MG tablet Take 12.5 mg by mouth as needed for dizziness.      Melatonin 10 MG TABS Take 1 tablet by mouth at bedtime.     montelukast (SINGULAIR) 10 MG  tablet Take 10 mg by mouth at bedtime.     Multiple Vitamins-Minerals (EQ MULTIVITAMINS ADULT GUMMY PO) Take 2 tablets by mouth daily.     triamcinolone (NASACORT ALLERGY 24HR) 55 MCG/ACT AERO nasal inhaler Place 1 spray into the nose daily as needed. Congestion     estradiol (VIVELLE-DOT) 0.05 MG/24HR patch Place 0.25 mg onto the skin 2 (two) times a week. (Patient not taking: Reported on 08/07/2019)     No current facility-administered medications for this visit.    Allergies  Allergen Reactions   Penicillins Other (See Comments)    "My muscles relax" Unknown Did it involve swelling of the face/tongue/throat, SOB, or low BP?  Unknown Did it involve sudden or severe rash/hives, skin peeling, or any reaction on the inside of your mouth or nose? Unknown Did you need to seek medical attention at a hospital or doctor's office? Yes When did it last happen? If all above answers are NO, may proceed with cephalosporin use.      Review of Systems:  Constitutional: Denies fever, chills, diaphoresis, appetite change and fatigue.  HEENT: Denies photophobia, eye pain, redness, hearing loss, ear pain, congestion, sore throat, rhinorrhea, sneezing, mouth sores, neck pain, neck stiffness and tinnitus.   Respiratory: Denies SOB, DOE, cough, chest tightness,  and wheezing.   Cardiovascular: Denies chest pain, palpitations and leg swelling.  Genitourinary: Denies dysuria, urgency, frequency, hematuria, flank pain and difficulty urinating.  Musculoskeletal: Denies myalgias, back pain, joint swelling, arthralgias and gait problem.  Skin: No rash.  Neurological: Denies dizziness, seizures, syncope, weakness, light-headedness, numbness and headaches.  Hematological: Denies adenopathy. Easy bruising, personal or family bleeding history  Psychiatric/Behavioral: No anxiety or depression     Physical Exam:    BP 100/66    Pulse 83    Ht 5\' 3"  (1.6 m)    Wt 140 lb 6 oz (63.7 kg)    BMI 24.87 kg/m  Wt Readings from Last 3 Encounters:  08/07/19 140 lb 6 oz (63.7 kg)  05/07/19 140 lb (63.5 kg)  02/01/19 139 lb (63 kg)   Constitutional:  Well-developed, in no acute distress. Psychiatric: Normal mood and affect. Behavior is normal. HEENT: Pupils normal.  Conjunctivae are normal. No scleral icterus. Neck supple.  Cardiovascular: Normal rate, regular rhythm. No edema Pulmonary/chest: Effort normal and breath sounds normal. No wheezing, rales or rhonchi. Abdominal: Soft, nondistended. Nontender. Bowel sounds active throughout. There are no masses palpable. No hepatomegaly. Rectal:  defered Neurological: Alert and oriented to  person place and time. Skin: Skin is warm and dry. No rashes noted.  Data Reviewed: I have personally reviewed following labs and imaging studies  CBC: CBC Latest Ref Rng & Units 01/27/2019  WBC 4.0 - 10.5 K/uL 10.0  Hemoglobin 12.0 - 15.0 g/dL 12.4  Hematocrit 36 - 46 % 38.0  Platelets 150 - 400 K/uL 266    CMP: CMP Latest Ref Rng & Units 01/27/2019  Glucose 70 - 99 mg/dL 104(H)  BUN 6 - 20 mg/dL 23(H)  Creatinine 0.44 - 1.00 mg/dL 0.66  Sodium 135 - 145 mmol/L 138  Potassium 3.5 - 5.1 mmol/L 4.3  Chloride 98 - 111 mmol/L 104  CO2 22 - 32 mmol/L 25  Calcium 8.9 - 10.3 mg/dL 9.1     Carmell Austria, MD 08/07/2019, 2:44 PM  Cc: Angelina Sheriff, MD

## 2019-08-07 NOTE — Patient Instructions (Signed)
If you are age 54 or older, your body mass index should be between 23-30. Your Body mass index is 24.87 kg/m. If this is out of the aforementioned range listed, please consider follow up with your Primary Care Provider.  If you are age 100 or younger, your body mass index should be between 19-25. Your Body mass index is 24.87 kg/m. If this is out of the aformentioned range listed, please consider follow up with your Primary Care Provider.   You have been scheduled for a colonoscopy. Please follow written instructions given to you at your visit today.    If you use inhalers (even only as needed), please bring them with you on the day of your procedure.  You have been given a Clenpiq prep sample.  Thank you,  Dr. Jackquline Denmark

## 2019-08-07 NOTE — Addendum Note (Signed)
Addended by: Candie Mile on: 08/07/2019 05:08 PM   Modules accepted: Orders

## 2019-09-10 ENCOUNTER — Encounter: Payer: Self-pay | Admitting: Gastroenterology

## 2019-09-16 ENCOUNTER — Encounter: Payer: No Typology Code available for payment source | Admitting: Gastroenterology

## 2019-10-09 ENCOUNTER — Encounter: Payer: No Typology Code available for payment source | Admitting: Gastroenterology

## 2019-10-22 ENCOUNTER — Other Ambulatory Visit: Payer: Self-pay

## 2019-10-22 ENCOUNTER — Encounter: Payer: Self-pay | Admitting: Gastroenterology

## 2019-10-22 ENCOUNTER — Ambulatory Visit (AMBULATORY_SURGERY_CENTER): Payer: No Typology Code available for payment source | Admitting: Gastroenterology

## 2019-10-22 VITALS — BP 108/65 | HR 58 | Temp 98.0°F | Resp 15 | Ht 63.0 in | Wt 140.0 lb

## 2019-10-22 DIAGNOSIS — D123 Benign neoplasm of transverse colon: Secondary | ICD-10-CM

## 2019-10-22 DIAGNOSIS — D125 Benign neoplasm of sigmoid colon: Secondary | ICD-10-CM | POA: Diagnosis not present

## 2019-10-22 DIAGNOSIS — D128 Benign neoplasm of rectum: Secondary | ICD-10-CM

## 2019-10-22 DIAGNOSIS — Z86010 Personal history of colonic polyps: Secondary | ICD-10-CM

## 2019-10-22 DIAGNOSIS — D122 Benign neoplasm of ascending colon: Secondary | ICD-10-CM | POA: Diagnosis not present

## 2019-10-22 DIAGNOSIS — D127 Benign neoplasm of rectosigmoid junction: Secondary | ICD-10-CM | POA: Diagnosis not present

## 2019-10-22 DIAGNOSIS — D120 Benign neoplasm of cecum: Secondary | ICD-10-CM

## 2019-10-22 MED ORDER — SODIUM CHLORIDE 0.9 % IV SOLN: 500.0000 mL | Freq: Once | INTRAVENOUS | Status: DC

## 2019-10-22 NOTE — Patient Instructions (Signed)
HANDOUTS PROVIDED ON: polyps and hemorrhoids  The polyps removed today have been sent for pathology.  The results can take 1-3 weeks to receive.  When your next colonoscopy should occur will be based on the pathology results.    You may resume your previous diet and medication schedule.  Thank you for allowing [place] to care for you today!!!   YOU HAD AN ENDOSCOPIC PROCEDURE TODAY AT [facility]:   Refer to the procedure report that was given to you for any specific questions about what was found during the examination.  If the procedure report does not answer your questions, please call your gastroenterologist to clarify.  If you requested that your care partner not be given the details of your procedure findings, then the procedure report has been included in a sealed envelope for you to review at your convenience later.  YOU SHOULD EXPECT: Some feelings of bloating in the abdomen. Passage of more gas than usual.  Walking can help get rid of the air that was put into your GI tract during the procedure and reduce the bloating. If you had a lower endoscopy (such as a colonoscopy or flexible sigmoidoscopy) you may notice spotting of blood in your stool or on the toilet paper. If you underwent a bowel prep for your procedure, you may not have a normal bowel movement for a few days.  Please Note:  You might notice some irritation and congestion in your nose or some drainage.  This is from the oxygen used during your procedure.  There is no need for concern and it should clear up in a day or so.  SYMPTOMS TO REPORT IMMEDIATELY:   Following lower endoscopy (colonoscopy or flexible sigmoidoscopy):  Excessive amounts of blood in the stool  Significant tenderness or worsening of abdominal pains  Swelling of the abdomen that is new, acute  Fever of 100F or higher    For urgent or emergent issues, a gastroenterologist can be reached at any hour by calling [phone]. Do not use  MyChart messaging for urgent concerns.    DIET:  We do recommend a small meal at first, but then you may proceed to your regular diet.  Drink plenty of fluids but you should avoid alcoholic beverages for 24 hours.  ACTIVITY:  You should plan to take it easy for the rest of today and you should NOT DRIVE or use heavy machinery until tomorrow (because of the sedation medicines used during the test).    FOLLOW UP: Our staff will call the number listed on your records 48-72 hours following your procedure to check on you and address any questions or concerns that you may have regarding the information given to you following your procedure. If we do not reach you, we will leave a message.  We will attempt to reach you two times.  During this call, we will ask if you have developed any symptoms of COVID 19. If you develop any symptoms (ie: fever, flu-like symptoms, shortness of breath, cough etc.) before then, please call [phone].  If you test positive for Covid 19 in the 2 weeks post procedure, please call and report this information to [place].    If any biopsies were taken you will be contacted by phone or by letter within the next 1-3 weeks.  Please call us at [phone] if you have not heard about the biopsies in 3 weeks.    SIGNATURES/CONFIDENTIALITY: You and/or your care partner have signed paperwork which  will be entered into your electronic medical record.  These signatures attest to the fact that that the information above on your After Visit Summary has been reviewed and is understood.  Full responsibility of the confidentiality of this discharge information lies with you and/or your care-partner.

## 2019-10-22 NOTE — Op Note (Addendum)
[facility] Patient Name: Holly Deleon Procedure Date: 10/22/2019 11:11 AM MRN: [id number] Endoscopist: Jackquline Denmark , MD Age: 54 Referring MD:  Date of Birth: 1965-10-25 Gender: Female Account #: [id number] Procedure:                Colonoscopy Indications:              High risk colon cancer surveillance: Personal                            history of colonic polyps. FH of colon cancer (Dad                            at age 9) Medicines:                Monitored Anesthesia Care Procedure:                Pre-Anesthesia Assessment:                           - Prior to the procedure, a History and Physical                            was performed, and patient medications and                            allergies were reviewed. The patient's tolerance of                            previous anesthesia was also reviewed. The risks                            and benefits of the procedure and the sedation                            options and risks were discussed with the patient.                            All questions were answered, and informed consent                            was obtained. Prior Anticoagulants: The patient has                            taken no previous anticoagulant or antiplatelet                            agents. ASA Grade Assessment: II - A patient with                            mild systemic disease. After reviewing the risks                            and benefits, the patient was deemed in  satisfactory condition to undergo the procedure.                           After obtaining informed consent, the colonoscope                            was passed under direct vision. Throughout the                            procedure, the patient's blood pressure, pulse, and                            oxygen saturations were monitored continuously. The                            Colonoscope was introduced through the anus and                             advanced to the 2 cm into the ileum. The                            colonoscopy was performed without difficulty. The                            patient tolerated the procedure well. The quality                            of the bowel preparation was good. The terminal                            ileum, ileocecal valve, appendiceal orifice, and                            rectum were photographed. Scope In: 11:14:38 AM Scope Out: 11:35:20 AM Scope Withdrawal Time: 0 hours 16 minutes 4 seconds  Total Procedure Duration: 0 hours 20 minutes 42 seconds  Findings:                 Three sessile polyps were found in the proximal                            transverse colon, ascending colon and cecum. The                            polyps were 1 to 2 mm in size. These polyps were                            removed with a cold biopsy forceps. Resection and                            retrieval were complete.                           Three sessile polyps were found in the mid  transverse colon. The polyps were 4 to 6 mm in                            size. These polyps were removed with a cold snare.                            Resection and retrieval were complete.                           Three sessile polyps were found in the                            recto-sigmoid colon and sigmoid colon. The polyps                            were 6 to 8 mm in size. These polyps were removed                            with a cold snare. Resection and retrieval were                            complete.                           Non-bleeding internal hemorrhoids were found during                            retroflexion. The hemorrhoids were small.                           The terminal ileum appeared normal.                           The exam was otherwise without abnormality on                            direct and retroflexion views. Complications:            No  immediate complications. Estimated Blood Loss:     Estimated blood loss: none. Impression:               -Colonic polyps s/p polypectomy.                           -Small internal hemorrhoids.                           -Otherwise normal colonoscopy to TI. Recommendation:           - Patient has a contact number available for                            emergencies. The signs and symptoms of potential                            delayed complications were discussed with the  patient. Return to normal activities tomorrow.                            Written discharge instructions were provided to the                            patient.                           - Resume previous diet.                           - Continue present medications.                           - Await pathology results.                           - Repeat colonoscopy for surveillance based on                            pathology results.                           - Return to GI clinic PRN. Jackquline Denmark, MD 10/22/2019 11:41:07 AM This report has been signed electronically.

## 2019-10-22 NOTE — Progress Notes (Signed)
Pt's states no medical or surgical changes since previsit or office visit. 

## 2019-10-22 NOTE — Progress Notes (Signed)
[name] vital signs. 

## 2019-10-22 NOTE — Progress Notes (Signed)
Called to room to assist during endoscopic procedure.  Patient ID and intended procedure confirmed with present staff. Received instructions for my participation in the procedure from the performing physician.  

## 2019-10-22 NOTE — Progress Notes (Signed)
PT taken to PACU. Monitors in place. VSS. Report given to RN. 

## 2019-10-24 ENCOUNTER — Telehealth: Payer: Self-pay

## 2019-10-24 NOTE — Telephone Encounter (Signed)
  Follow up Call-  Call back number 10/22/2019  Post procedure Call Back phone  # [phone]  Permission to leave phone message Yes  Some recent data might be hidden     Patient questions:  Do you have a fever, pain , or abdominal swelling? No. Pain Score  0 *  Have you tolerated food without any problems? Yes.    Have you been able to return to your normal activities? Yes.    Do you have any questions about your discharge instructions: Diet   No. Medications  No. Follow up visit  No.  Do you have questions or concerns about your Care? No.  Actions: * If pain score is 4 or above: No action needed, pain <4. 1. Have you developed a fever since your procedure? no  2.   Have you had an respiratory symptoms (SOB or cough) since your procedure? no  3.   Have you tested positive for COVID 19 since your procedure no  4.   Have you had any family members/close contacts diagnosed with the COVID 19 since your procedure?  no   If yes to any of these questions please route to Joylene John, RN and Joella Prince, RN

## 2019-10-30 ENCOUNTER — Encounter: Payer: Self-pay | Admitting: Gastroenterology

## 2020-03-10 DIAGNOSIS — M5020 Other cervical disc displacement, unspecified cervical region: Secondary | ICD-10-CM

## 2020-03-10 HISTORY — DX: Other cervical disc displacement, unspecified cervical region: M50.20

## 2020-03-11 ENCOUNTER — Encounter: Payer: Self-pay | Admitting: Cardiology

## 2020-04-01 ENCOUNTER — Other Ambulatory Visit: Payer: Self-pay

## 2020-04-01 ENCOUNTER — Encounter: Payer: Self-pay | Admitting: Cardiology

## 2020-04-01 ENCOUNTER — Ambulatory Visit (INDEPENDENT_AMBULATORY_CARE_PROVIDER_SITE_OTHER): Payer: No Typology Code available for payment source | Admitting: Cardiology

## 2020-04-01 VITALS — BP 98/70 | HR 70 | Ht 63.0 in | Wt 136.0 lb

## 2020-04-01 DIAGNOSIS — E0590 Thyrotoxicosis, unspecified without thyrotoxic crisis or storm: Secondary | ICD-10-CM | POA: Insufficient documentation

## 2020-04-01 DIAGNOSIS — R55 Syncope and collapse: Secondary | ICD-10-CM

## 2020-04-01 DIAGNOSIS — Z720 Tobacco use: Secondary | ICD-10-CM | POA: Diagnosis not present

## 2020-04-01 DIAGNOSIS — R002 Palpitations: Secondary | ICD-10-CM | POA: Diagnosis not present

## 2020-04-01 HISTORY — DX: Thyrotoxicosis, unspecified without thyrotoxic crisis or storm: E05.90

## 2020-04-01 NOTE — Progress Notes (Signed)
Cardiology Office Note:    Date:  04/01/2020   ID:  Holly Deleon, DOB Mar 21, 1965, MRN [id number]  PCP:  Holly Sheriff, MD  Cardiologist:  Holly Campus, MD    Referring MD: Holly Sheriff, MD   Chief Complaint  Patient presents with  . Low BP  . Fatigue    History of Present Illness:    Holly Deleon is a 55 y.o. female low blood pressure and fatigue.  She is being evaluated previously by Dr. Ander Deleon.  That evaluation included monitor which showed no significant arrhythmia, echocardiogram being done which showed preserved normal left ventricle ejection fraction.  She was advised to stay well-hydrated also liberate some salt intake.  Recently she started having more problems.  Sometimes she gets very weak and tired she check her blood pressure and her blood pressure will be 80 that usually happen after she walks.  She did not have any recent episode of syncope but dizziness happens quite often and bothers her a lot.  Also complained of having fatigue and tiredness.  No palpitations no chest pain tightness squeezing pressure burning chest.  Past Medical History:  Diagnosis Date  . Abnormal cervical Papanicolaou smear 02/08/2018  . Allergic rhinitis   . Asthma   . Chronic sinusitis   . Herniated cervical disc 03/10/2020  . IBS (irritable bowel syndrome)   . Irritable bowel syndrome 02/08/2018  . Palpitations 02/01/2019  . Sleep apnea   . Syncope and collapse 02/01/2019  . Tobacco use 02/01/2019  . Vertigo     Past Surgical History:  Procedure Laterality Date  . COLONOSCOPY  01/22/2016   Colon Polyp(s). Internal hemorrhoids  . LAPAROSCOPIC HYSTERECTOMY    . SINOSCOPY      Current Medications: Current Meds  Medication Sig  . albuterol (PROVENTIL HFA;VENTOLIN HFA) 108 (90 Base) MCG/ACT inhaler Inhale 2 puffs into the lungs every 4 (four) hours as needed for wheezing.   . fluconazole (DIFLUCAN) 200 MG tablet Take 200 mg by mouth daily.  . meclizine (ANTIVERT) 12.5 MG tablet  Take 12.5 mg by mouth as needed for dizziness.      Allergies:   Penicillins   Social History   Socioeconomic History  . Marital status: Single    Spouse name: Not on file  . Number of children: Not on file  . Years of education: Not on file  . Highest education level: Not on file  Occupational History  . Not on file  Tobacco Use  . Smoking status: Current Every Day Smoker    Packs/day: 1.00    Types: Cigarettes  . Smokeless tobacco: Never Used  Vaping Use  . Vaping Use: Never used  Substance and Sexual Activity  . Alcohol use: Yes    Comment: Rare, special occasion  . Drug use: Not Currently  . Sexual activity: Not on file  Other Topics Concern  . Not on file  Social History Narrative  . Not on file   Social Determinants of Health   Financial Resource Strain: Not on file  Food Insecurity: Not on file  Transportation Needs: Not on file  Physical Activity: Not on file  Stress: Not on file  Social Connections: Not on file     Family History: The patient's family history includes Allergic rhinitis in her mother and son; Arthritis in her mother; Colon cancer in her father; Heart attack in her maternal grandfather and maternal uncle; High Cholesterol in her father; Irritable bowel syndrome in her maternal grandmother,  mother, and niece; Stroke in her maternal aunt and maternal grandmother. ROS:   Please see the history of present illness.    All 14 point review of systems negative except as described per history of present illness  EKGs/Labs/Other Studies Reviewed:      Recent Labs: No results found for requested labs within last 8760 hours.  Recent Lipid Panel No results found for: CHOL, TRIG, HDL, CHOLHDL, VLDL, LDLCALC, LDLDIRECT  Physical Exam:    VS:  BP 98/70 (BP Location: Left Arm, Patient Position: Lying left side)   Pulse 70   Ht 5\' 3"  (1.6 m)   Wt 136 lb (61.7 kg)   SpO2 97%   BMI 24.09 kg/m     Wt Readings from Last 3 Encounters:  04/01/20 136  lb (61.7 kg)  01/30/19 140 lb (63.5 kg)  10/22/19 140 lb (63.5 kg)     GEN:  Well nourished, well developed in no acute distress HEENT: Normal NECK: No JVD; No carotid bruits LYMPHATICS: No lymphadenopathy CARDIAC: RRR, no murmurs, no rubs, no gallops RESPIRATORY:  Clear to auscultation without rales, wheezing or rhonchi  ABDOMEN: Soft, non-tender, non-distended MUSCULOSKELETAL:  No edema; No deformity  SKIN: Warm and dry LOWER EXTREMITIES: no swelling NEUROLOGIC:  Alert and oriented x 3 PSYCHIATRIC:  Normal affect   ASSESSMENT:    1. Syncope and collapse   2. Palpitations   3. Tobacco use   4. Hyperthyroidism    PLAN:    In order of problems listed above:  1. Hypotension with history of previous syncope and collapse.  She is telling well-hydrated she is very much aware of the fact that she need to do that she said that she try to eliminate salt before but now she liberated her salt intake she eats a lot of peanuts.  I told her it is important to continue taking higher content of salty foods.  We also initiated conversation about potentially starting some medication we have and talk about midodrine however I prefer to do nonpharmacological management before we reach for some more powerful medications.  She also was noted to have low TSH on her examinations from her laboratory test done by primary care physician she may need to be investigated for hyperthyroidism. 2. Palpitations denies having any, monitor showed did not show any significant finding that was done last year. 3. Tobacco abuse obviously significant problem she understand that she feels guilty about it she smokes much less than before only half pack per day but I told her ultimate goal will be completely discontinue this bad habit. 4. Hyper thyroidism I suspect.  But will be managed by primary care physician.  Thyroid profile need to be done to clarify that.  That contribute to her symptomatology.   Medication  Adjustments/Labs and Tests Ordered: Current medicines are reviewed at length with the patient today.  Concerns regarding medicines are outlined above.  No orders of the defined types were placed in this encounter.  Medication changes: No orders of the defined types were placed in this encounter.   Signed, Park Liter, MD, [facility] 04/01/2020 10:12 AM    [place]

## 2020-04-01 NOTE — Patient Instructions (Signed)
Medication Instructions:  Your physician recommends that you continue on your current medications as directed. Please refer to the Current Medication list given to you today.  *If you need a refill on your cardiac medications before your next appointment, please call your pharmacy*   Lab Work: None If you have labs (blood work) drawn today and your tests are completely normal, you will receive your results only by: Marland Kitchen MyChart Message (if you have MyChart) OR . A paper copy in the mail If you have any lab test that is abnormal or we need to change your treatment, we will call you to review the results.   Testing/Procedures: none   Follow-Up: At [facility], you and your health needs are our priority.  As part of our continuing mission to provide you with exceptional heart care, we have created designated Provider Care Teams.  These Care Teams include your primary Cardiologist (physician) and Advanced Practice Providers (APPs -  Physician Assistants and Nurse Practitioners) who all work together to provide you with the care you need, when you need it.  We recommend signing up for the patient portal called "MyChart".  Sign up information is provided on this After Visit Summary.  MyChart is used to connect with patients for Virtual Visits (Telemedicine).  Patients are able to view lab/test results, encounter notes, upcoming appointments, etc.  Non-urgent messages can be sent to your provider as well.   To learn more about what you can do with MyChart, go to [email or web address].    Your next appointment:   1 month(s)  The format for your next appointment:   In Person  Provider:   Jenne Campus, MD   Other Instructions

## 2020-04-13 ENCOUNTER — Telehealth: Payer: Self-pay | Admitting: Cardiology

## 2020-04-13 DIAGNOSIS — E0590 Thyrotoxicosis, unspecified without thyrotoxic crisis or storm: Secondary | ICD-10-CM

## 2020-04-13 NOTE — Telephone Encounter (Signed)
    Pt said Dr. Raliegh Ip needs lab work to check her tyroid but her pcp not yet receive order. She wanted to [facility] Dr. Lin Landsman

## 2020-04-14 NOTE — Telephone Encounter (Signed)
If she prefers this that be fine

## 2020-04-17 NOTE — Telephone Encounter (Signed)
Patient informed lab orders in and she can come have them drawn.

## 2020-04-17 NOTE — Telephone Encounter (Signed)
Left message for patient to return call.

## 2020-04-17 NOTE — Telephone Encounter (Signed)
PT is returning a phone call.Please advise

## 2020-04-21 LAB — TSH: TSH: 1.71 u[IU]/mL (ref 0.450–4.500)

## 2020-04-23 ENCOUNTER — Telehealth: Payer: Self-pay

## 2020-04-23 NOTE — Telephone Encounter (Signed)
Patient notified of results.

## 2020-04-23 NOTE — Telephone Encounter (Signed)
-----   Message from Park Liter, MD sent at 04/23/2020 12:54 PM EDT ----- Thyroid function test is perfectly normal

## 2020-04-29 DIAGNOSIS — J45909 Unspecified asthma, uncomplicated: Secondary | ICD-10-CM | POA: Insufficient documentation

## 2020-04-29 DIAGNOSIS — J309 Allergic rhinitis, unspecified: Secondary | ICD-10-CM | POA: Insufficient documentation

## 2020-04-29 DIAGNOSIS — K589 Irritable bowel syndrome without diarrhea: Secondary | ICD-10-CM | POA: Insufficient documentation

## 2020-04-29 DIAGNOSIS — G4730 Sleep apnea, unspecified: Secondary | ICD-10-CM | POA: Insufficient documentation

## 2020-04-29 DIAGNOSIS — R42 Dizziness and giddiness: Secondary | ICD-10-CM | POA: Insufficient documentation

## 2020-05-07 ENCOUNTER — Ambulatory Visit: Payer: No Typology Code available for payment source | Admitting: Cardiology

## 2020-12-12 IMAGING — MG DIGITAL DIAGNOSTIC UNILATERAL LEFT MAMMOGRAM
4 series · 4 of 4 positions shown · non-contrast
Comparison: Previous exam(s).

CLINICAL DATA: 52-year-old female recalled from screening mammogram
dated 02/15/2018 for the left breast.

EXAM:
DIGITAL DIAGNOSTIC LEFT MAMMOGRAM WITH CAD

[L ML (1 of 2)]
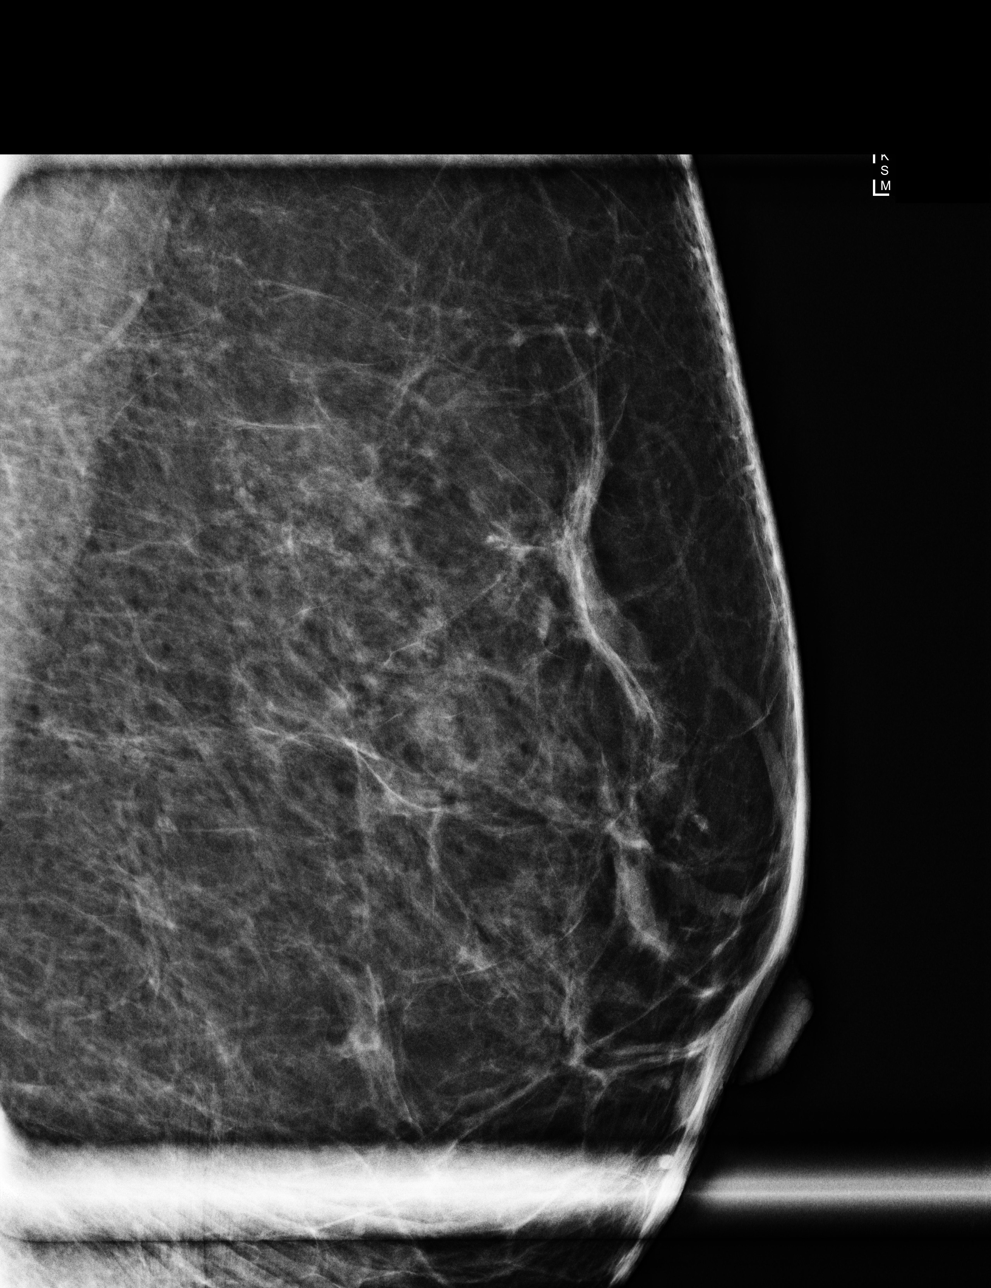

[L CC (1 of 2)]
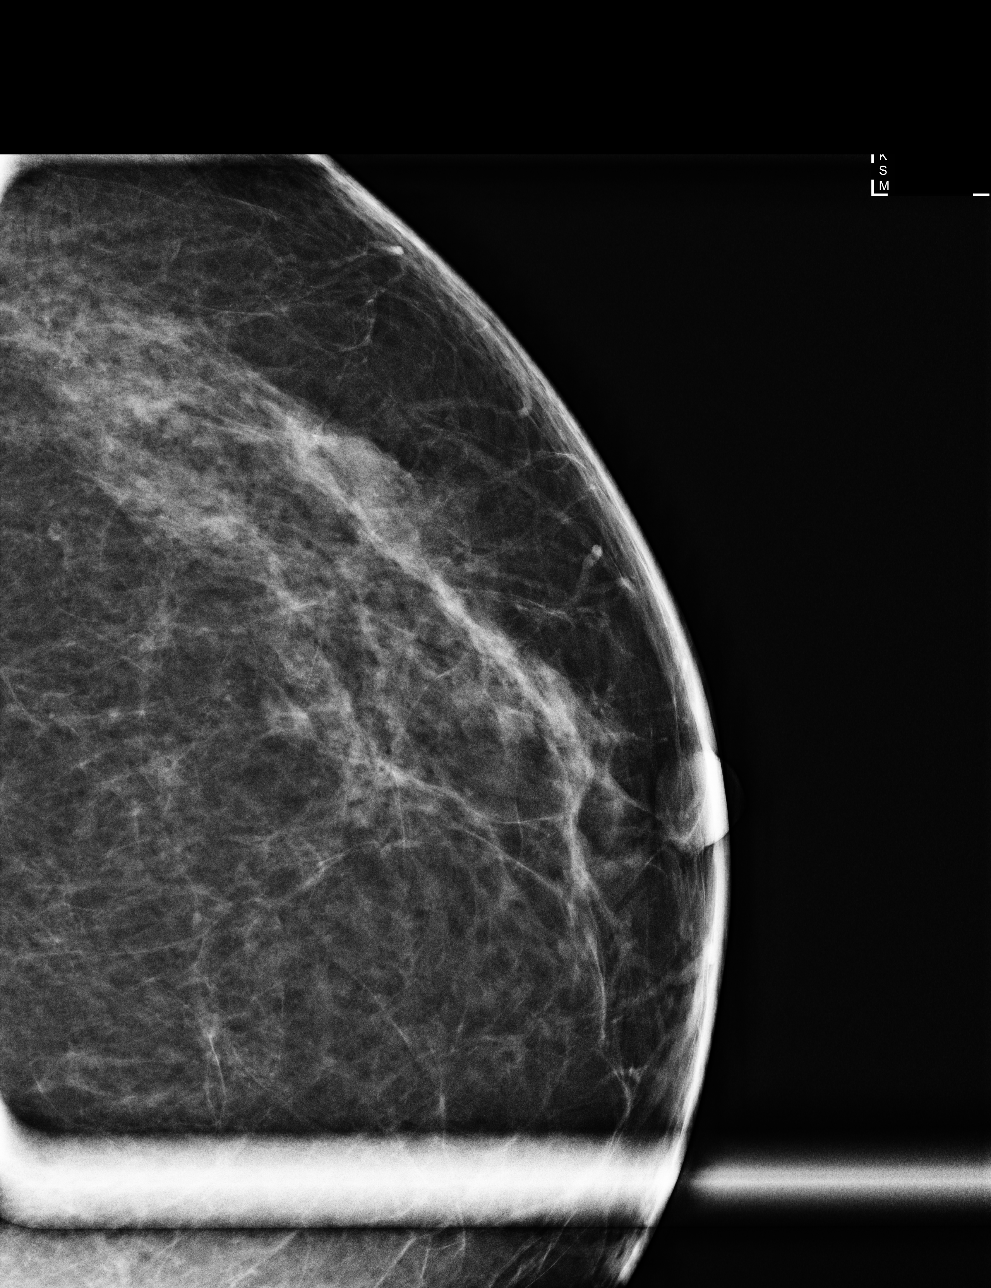

[L ML (2 of 2)]
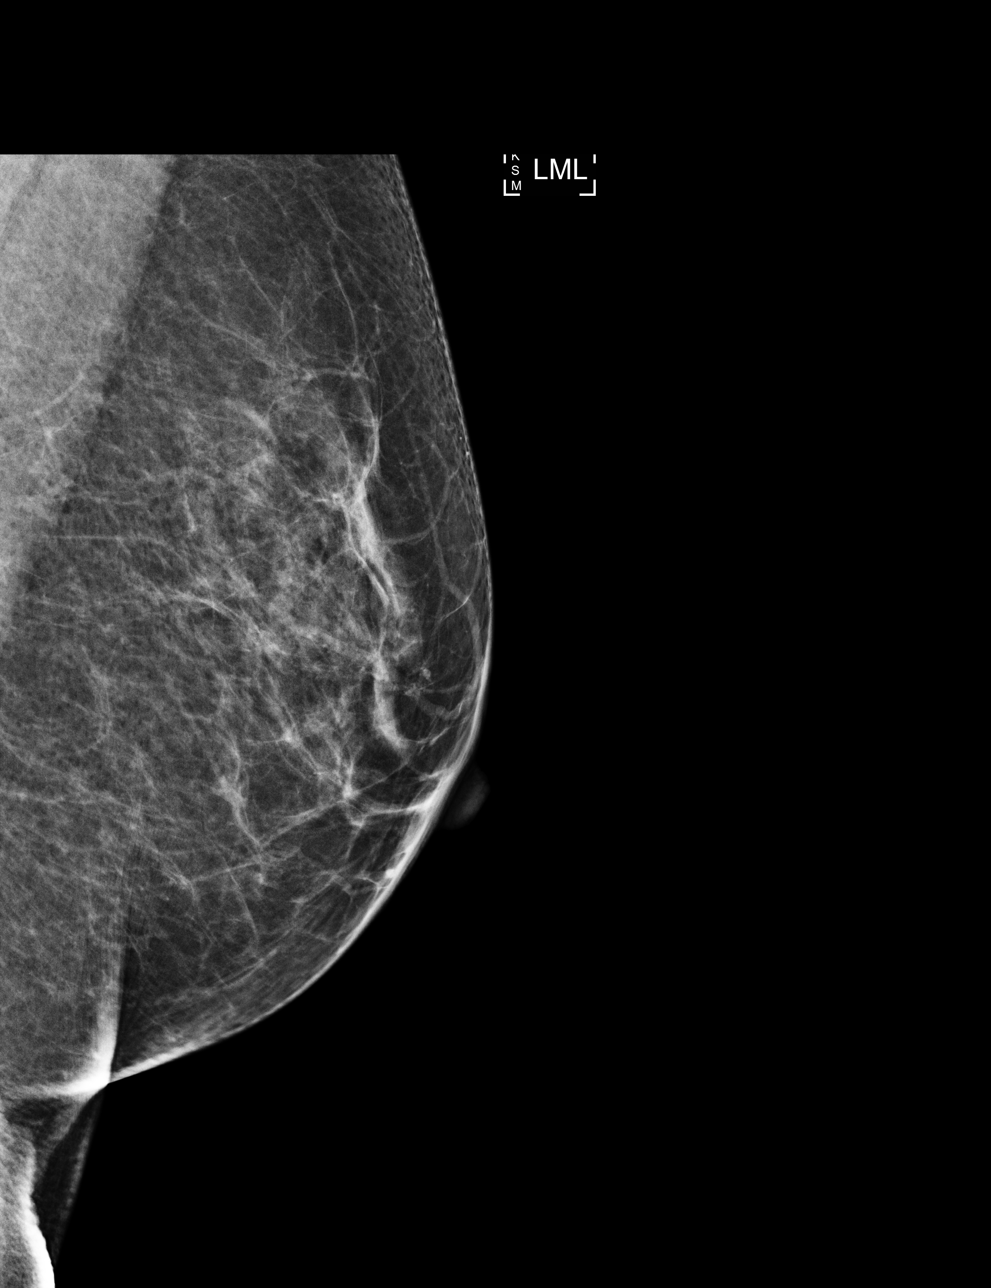

[L CC (2 of 2)]
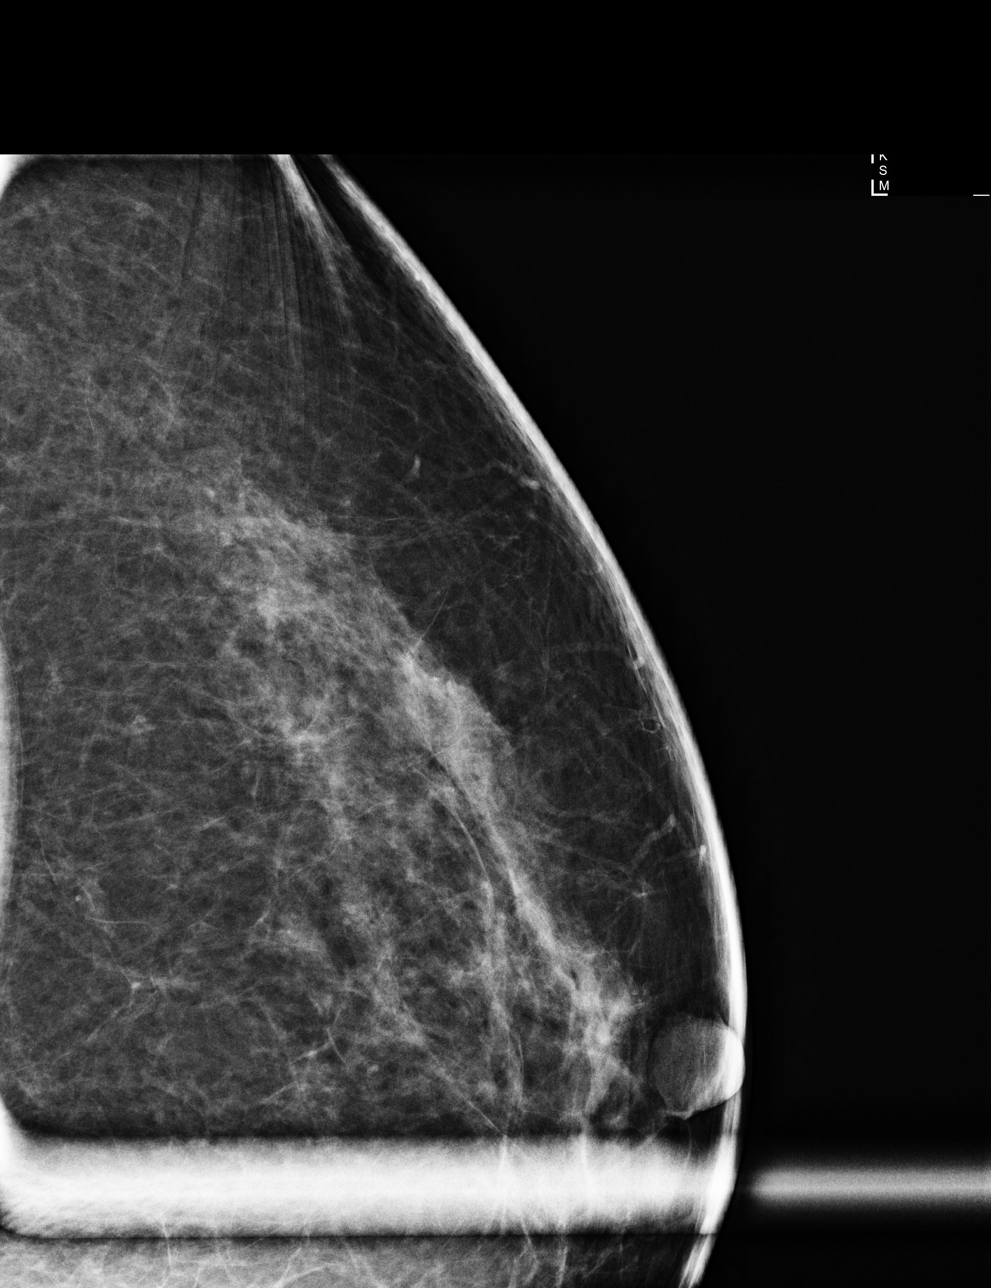

[4 of 4 positions shown; findings below may reference images not displayed]

ACR Breast Density Category b: There are scattered areas of
fibroglandular density.
FINDINGS: Question calcifications in the upper-outer quadrant of the left
breast on prior screening study does not persist on today's
additional magnified views. These were likely artifactual from the
synthetic projection.

Mammographic images were processed with CAD.
IMPRESSION: No persistent, suspicious mammographic abnormalities.

RECOMMENDATION:
Screening mammogram in one year.(Code:[id number])

I have discussed the findings and recommendations with the patient.
Results were also provided in writing at the conclusion of the
visit. If applicable, a reminder letter will be sent to the patient
regarding the next appointment.

BI-RADS CATEGORY  1: Negative.

## 2021-07-27 ENCOUNTER — Encounter: Payer: Self-pay | Admitting: Pulmonary Disease

## 2021-08-18 ENCOUNTER — Encounter: Payer: Self-pay | Admitting: Pulmonary Disease

## 2021-08-18 ENCOUNTER — Ambulatory Visit (INDEPENDENT_AMBULATORY_CARE_PROVIDER_SITE_OTHER): Payer: No Typology Code available for payment source | Admitting: Pulmonary Disease

## 2021-08-18 VITALS — BP 118/74 | HR 63 | Ht 63.0 in | Wt 151.8 lb

## 2021-08-18 DIAGNOSIS — R0609 Other forms of dyspnea: Secondary | ICD-10-CM | POA: Diagnosis not present

## 2021-08-18 DIAGNOSIS — Z720 Tobacco use: Secondary | ICD-10-CM | POA: Diagnosis not present

## 2021-08-18 DIAGNOSIS — J449 Chronic obstructive pulmonary disease, unspecified: Secondary | ICD-10-CM | POA: Diagnosis not present

## 2021-08-18 MED ORDER — ANORO ELLIPTA 62.5-25 MCG/ACT IN AEPB: 1.0000 | INHALATION_SPRAY | Freq: Every day | RESPIRATORY_TRACT | 0 refills | Status: DC

## 2021-08-18 NOTE — Progress Notes (Signed)
Synopsis: Referred in August 2023 for dyspnea.  Has smoked cigarettes: 1 ppd for 30 years, quit in July 2023.  Subjective:   PATIENT ID: Holly Deleon GENDER: female DOB: [birth year], MRN: [id number]   HPI  Chief Complaint  Patient presents with   Consult    Referred by PCP for possible COPD/asthma. States she quits smoking on July 13th.     Dyspnea: > she says that she has always had chronic sinusitis > never really took any medicine the past for her breathing > she feels tightness in her left chest > happens in the heat > feels it when she walks up stairs > can walk through the grocery store, but feels dyspnea > can carry in groceries and climba  flight of stairs > she is more fatigued by it > feels more tired than normal > tried [place], didn't help  She had a breathing test in July 2023 which was normal.   She has had recurrent bronchitis over the years.  Has been a few years.    Cough:  > better since she quit smoking > she used to produce a lot of mucus, it's better now  Quit smoking in July and gained 18 pounds after that.  Says she had pneumonia in July.    Record review: She was seen by cardiology in June for hypertension and palpitations.  Had a normal echo and event monitor, noted tobacco abuse.    Past Medical History:  Diagnosis Date   Abnormal cervical Papanicolaou smear 02/08/2018   Allergic rhinitis    Asthma    Chronic sinusitis    Herniated cervical disc 03/10/2020   Hyperthyroidism 04/01/2020   IBS (irritable bowel syndrome)    Irritable bowel syndrome 02/08/2018   Palpitations 02/01/2019   Sleep apnea    Syncope and collapse 02/01/2019   Tobacco use 02/01/2019   Vertigo      Family History  Problem Relation Age of Onset   Allergic rhinitis Mother    Arthritis Mother    Irritable bowel syndrome Mother    High Cholesterol Father    Colon cancer Father    Heart attack Maternal Grandfather    Allergic rhinitis Son    Stroke Maternal  Grandmother    Irritable bowel syndrome Maternal Grandmother    Stroke Maternal Aunt    Heart attack Maternal Uncle    Irritable bowel syndrome Niece      Social History   Socioeconomic History   Marital status: Single    Spouse name: Not on file   Number of children: Not on file   Years of education: Not on file   Highest education level: Not on file  Occupational History   Not on file  Tobacco Use   Smoking status: Former    Packs/day: 1.00    Types: Cigarettes    Quit date: 07/15/2021    Years since quitting: 0.0   Smokeless tobacco: Never  Vaping Use   Vaping Use: Never used  Substance and Sexual Activity   Alcohol use: Yes    Comment: Rare, special occasion   Drug use: Not Currently   Sexual activity: Not on file  Other Topics Concern   Not on file  Social History Narrative   Not on file   Social Determinants of Health   Financial Resource Strain: Not on file  Food Insecurity: Not on file  Transportation Needs: Not on file  Physical Activity: Not on file  Stress: Not on file  Social  Connections: Not on file  Intimate Partner Violence: Not on file     Allergies  Allergen Reactions   Penicillins Other (See Comments)    "My muscles relax" Unknown Did it involve swelling of the face/tongue/throat, SOB, or low BP? Unknown Did it involve sudden or severe rash/hives, skin peeling, or any reaction on the inside of your mouth or nose? Unknown Did you need to seek medical attention at a hospital or doctor's office? Yes When did it last happen?       If all above answers are "NO", may proceed with cephalosporin use.       Outpatient Medications Prior to Visit  Medication Sig Dispense Refill   albuterol (VENTOLIN HFA) 108 (90 Base) MCG/ACT inhaler Inhale 1-2 puffs into the lungs every 6 (six) hours as needed for wheezing or shortness of breath.     dicyclomine (BENTYL) 10 MG capsule Take 10 mg by mouth 4 (four) times daily -  before meals and at bedtime.      loratadine (CLARITIN) 10 MG tablet Take 10 mg by mouth daily.     meclizine (ANTIVERT) 12.5 MG tablet Take 12.5 mg by mouth 2 (two) times daily.     albuterol (PROVENTIL HFA;VENTOLIN HFA) 108 (90 Base) MCG/ACT inhaler Inhale 2 puffs into the lungs every 4 (four) hours as needed for wheezing.   1   fluconazole (DIFLUCAN) 200 MG tablet Take 200 mg by mouth daily.     meclizine (ANTIVERT) 12.5 MG tablet Take 12.5 mg by mouth as needed for dizziness.      No facility-administered medications prior to visit.    Review of Systems  Constitutional:  Negative for chills, fever, malaise/fatigue and weight loss.  HENT:  Negative for congestion, nosebleeds, sinus pain and sore throat.   Eyes:  Negative for photophobia, pain and discharge.  Respiratory:  Positive for cough, sputum production and shortness of breath. Negative for hemoptysis and wheezing.   Cardiovascular:  Negative for chest pain, palpitations, orthopnea and leg swelling.  Gastrointestinal:  Negative for abdominal pain, constipation, diarrhea, nausea and vomiting.  Genitourinary:  Negative for dysuria, frequency, hematuria and urgency.  Musculoskeletal:  Negative for back pain, joint pain, myalgias and neck pain.  Skin:  Negative for itching and rash.  Neurological:  Negative for tingling, tremors, sensory change, speech change, focal weakness, seizures, weakness and headaches.  Psychiatric/Behavioral:  Negative for memory loss, substance abuse and suicidal ideas. The patient is not nervous/anxious.       Objective:  Physical Exam   Vitals:   08/18/21 0859  BP: 118/74  Pulse: 63  SpO2: 99%  Weight: 151 lb 12.8 oz (68.9 kg)  Height: '5\' 3"'$  (1.6 m)  RA    Gen: well appearing, no acute distress HENT: NCAT, OP clear, neck supple without masses Eyes: PERRL, EOMi Lymph: no cervical lymphadenopathy PULM: CTA B CV: RRR, no mgr, no JVD GI: BS+, soft, nontender, no hsm Derm: no rash or skin breakdown MSK: normal bulk and  tone Neuro: A&Ox4, CN II-XII intact, strength 5/5 in all 4 extremities Psyche: normal mood and affect   CBC    Component Value Date/Time   WBC 10.0 01/27/2019 0448   RBC 3.98 01/27/2019 0448   HGB 12.4 01/27/2019 0448   HCT 38.0 01/27/2019 0448   PLT 266 01/27/2019 0448   MCV 95.5 01/27/2019 0448   MCH 31.2 01/27/2019 0448   MCHC 32.6 01/27/2019 0448   RDW 11.9 01/27/2019 0448     Chest imaging:  none  PFT: 2019 spirometry performed by asthma and allergy: Ratio 67% FEV1 2.2L 83% pred 2023 spirometry normal Ratio 77% FEV1 2.3L (performed by PCP)  Labs:  Path:  Echo: 03/2019 TTE > LVEF 60-65%, RV size and function normal  Heart Catheterization:       Assessment & Plan:   Tobacco use - Plan: Ambulatory Referral for Lung Cancer Scre  Dyspnea on exertion  Chronic obstructive pulmonary disease, unspecified COPD type ([place])  Discussion: Holly Deleon is here to see me today for shortness of breath cough and mucus production in the setting of a 30-pack-year smoking history.  She has normal spirometry but she has COPD based on symptoms and her smoking history.  I explained to her that even though her spirometry test is normal she has a diagnosis of COPD and fortunately has preserved lung function.  Staying away from cigarettes is the best thing she can possibly do.  She has had bronchitis in the past but hopefully if she stays away from cigarettes she will not have any more episodes of this.  Her dyspnea could be multifactorial, suspect that the 18 pound weight gain and lack of exercise has a lot to do with this.  We will try some bronchodilators but I think the most important thing will be regular exercise.  COPD: Fortunately, you have very mild COPD Stop Breo as tree We will try a sample of Anoro 1 puff daily no matter how you feel, call me after you have completed this and let me know if you think it is helpful so I can call in a prescription We will check your oxygen while  walking today Use albuterol as needed for chest tightness wheezing or shortness of breath Prevnar vaccine next visit Get a flu shot in the fall Practice good hand hygiene  Former cigarette smoker: Given your 30-pack-year smoking history we will refer you for lung cancer screening  Shortness of breath: There are many possible causes of this though I think your prior smoking history and diagnosis of COPD is 1 of many causes.  I also think that the weight gain and not exercising contributes. Try to exercise regularly, start walking on a regular basis Exercise is an important way to stay healthy, live longer, and have a better quality of life. You should dedicate time 3-5 days a week to intentional exercise. Start by planning a workout you know you can complete.  Once you have completed it try repeating it 2 more times in a week before you make it harder. The best and most effective exercise routines gradually get more difficult (the walk is longer, the run is faster, the weight is higher, etc.). Your consistency will guarantee success more than any other factor: stick with it no matter what.  No excuses. It's OK to feel short of breath when you exercise, but lightheadedness or pain is not OK.   If you are not comfortable exercising on your own let me know so we can find someone to help you.  Follow up in 6 weeks and we'll go over the lung cancer screening CT and see how you are feeling.    Current Outpatient Medications:    albuterol (VENTOLIN HFA) 108 (90 Base) MCG/ACT inhaler, Inhale 1-2 puffs into the lungs every 6 (six) hours as needed for wheezing or shortness of breath., Disp: , Rfl:    dicyclomine (BENTYL) 10 MG capsule, Take 10 mg by mouth 4 (four) times daily -  before meals and at bedtime.,  Disp: , Rfl:    loratadine (CLARITIN) 10 MG tablet, Take 10 mg by mouth daily., Disp: , Rfl:    meclizine (ANTIVERT) 12.5 MG tablet, Take 12.5 mg by mouth 2 (two) times daily., Disp: , Rfl:

## 2021-08-18 NOTE — Progress Notes (Signed)
Patient seen in the office today and instructed on use of Anoro.  Patient expressed understanding and demonstrated technique.  Benetta Spar [facility] 08/18/2021

## 2021-08-18 NOTE — Patient Instructions (Signed)
COPD: Fortunately, you have very mild COPD Stop Breo as tree We will try a sample of Anoro 1 puff daily no matter how you feel, call me after you have completed this and let me know if you think it is helpful so I can call in a prescription We will check your oxygen while walking today Use albuterol as needed for chest tightness wheezing or shortness of breath Prevnar vaccine today Get a flu shot in the fall Practice good hand hygiene  Former cigarette smoker: Given your 30-pack-year smoking history we will refer you for lung cancer screening  Shortness of breath: There are many possible causes of this though I think your prior smoking history and diagnosis of COPD is 1 of many causes.  I also think that the weight gain and not exercising contributes. Try to exercise regularly, start walking on a regular basis Exercise is an important way to stay healthy, live longer, and have a better quality of life. You should dedicate time 3-5 days a week to intentional exercise. Start by planning a workout you know you can complete.  Once you have completed it try repeating it 2 more times in a week before you make it harder. The best and most effective exercise routines gradually get more difficult (the walk is longer, the run is faster, the weight is higher, etc.). Your consistency will guarantee success more than any other factor: stick with it no matter what.  No excuses. It's OK to feel short of breath when you exercise, but lightheadedness or pain is not OK.   If you are not comfortable exercising on your own let me know so we can find someone to help you.  Follow up in 6 weeks and we'll go over the lung cancer screening CT and see how you are feeling.

## 2021-08-26 ENCOUNTER — Encounter: Payer: Self-pay | Admitting: Pulmonary Disease

## 2021-08-26 MED ORDER — ANORO ELLIPTA 62.5-25 MCG/ACT IN AEPB: 1.0000 | INHALATION_SPRAY | Freq: Every day | RESPIRATORY_TRACT | 2 refills | Status: DC

## 2021-08-27 ENCOUNTER — Telehealth: Payer: Self-pay | Admitting: Pulmonary Disease

## 2021-08-27 ENCOUNTER — Other Ambulatory Visit (HOSPITAL_COMMUNITY): Payer: Self-pay

## 2021-08-27 NOTE — Telephone Encounter (Signed)
Pharm team- will you please see note from pharmacy on this rx refill request and advise best possibly alternative for Anoro? Thanks so much!

## 2021-08-30 NOTE — Telephone Encounter (Signed)
I called and spoke with the pt  I offered to mail her forms for pt assistance for anoro  Verified her home address  I have mailed her the forms  She will return them once she completes  Nothing further needed

## 2021-09-09 ENCOUNTER — Other Ambulatory Visit (HOSPITAL_COMMUNITY): Payer: Self-pay

## 2021-09-09 NOTE — Telephone Encounter (Signed)
Pharmacy, is there a cheaper alternative to the Anoro for pt? Thank you so much!

## 2021-09-14 NOTE — Telephone Encounter (Signed)
Mychart message sent by pt: [name]  [facility] (supporting Juanito Doom, MD) Yesterday (9:49 AM)   I still have not got a call for lung screening.    Routing to lung nodule pool for them to follow up on.

## 2021-09-15 ENCOUNTER — Other Ambulatory Visit: Payer: Self-pay | Admitting: *Deleted

## 2021-09-15 DIAGNOSIS — Z122 Encounter for screening for malignant neoplasm of respiratory organs: Secondary | ICD-10-CM

## 2021-09-15 DIAGNOSIS — Z87891 Personal history of nicotine dependence: Secondary | ICD-10-CM

## 2021-10-06 ENCOUNTER — Ambulatory Visit (INDEPENDENT_AMBULATORY_CARE_PROVIDER_SITE_OTHER): Payer: No Typology Code available for payment source | Admitting: Acute Care

## 2021-10-06 ENCOUNTER — Encounter: Payer: Self-pay | Admitting: Acute Care

## 2021-10-06 ENCOUNTER — Other Ambulatory Visit: Payer: Self-pay | Admitting: Acute Care

## 2021-10-06 DIAGNOSIS — Z87891 Personal history of nicotine dependence: Secondary | ICD-10-CM

## 2021-10-06 DIAGNOSIS — Z6826 Body mass index (BMI) 26.0-26.9, adult: Secondary | ICD-10-CM

## 2021-10-06 NOTE — Patient Instructions (Signed)
Thank you for participating in the [facility]. It was our pleasure to meet you today. We will call you with the results of your scan within the next few days. Your scan will be assigned a Lung RADS category score by the physicians reading the scans.  This Lung RADS score determines follow up scanning.  See below for description of categories, and follow up screening recommendations. We will be in touch to schedule your follow up screening annually or based on recommendations of our providers. We will fax a copy of your scan results to your Primary Care Physician, or the physician who referred you to the program, to ensure they have the results. Please call the office if you have any questions or concerns regarding your scanning experience or results.  Our office number is [phone]. Please speak with Denise Phelps, RN. , or  Denise Buckner RN, They are  our Lung Cancer Screening RN.'s If They are unavailable when you call, Please leave a message on the voice mail. We will return your call at our earliest convenience.This voice mail is monitored several times a day.  Remember, if your scan is normal, we will scan you annually as long as you continue to meet the criteria for the program. (Age 55-77, Current smoker or smoker who has quit within the last 15 years). If you are a smoker, remember, quitting is the single most powerful action that you can take to decrease your risk of lung cancer and other pulmonary, breathing related problems. We know quitting is hard, and we are here to help.  Please let us know if there is anything we can do to help you meet your goal of quitting. If you are a former smoker, congratulations. We are proud of you! Remain smoke free! Remember you can refer friends or family members through the number above.  We will screen them to make sure they meet criteria for the program. Thank you for helping us take better care of you by  participating in Lung Screening.  You can receive free nicotine replacement therapy ( patches, gum or mints) by calling 1-800-QUIT NOW. Please call so we can get you on the path to becoming  a non-smoker. I know it is hard, but you can do this!  Lung RADS Categories:  Lung RADS 1: no nodules or definitely non-concerning nodules.  Recommendation is for a repeat annual scan in 12 months.  Lung RADS 2:  nodules that are non-concerning in appearance and behavior with a very low likelihood of becoming an active cancer. Recommendation is for a repeat annual scan in 12 months.  Lung RADS 3: nodules that are probably non-concerning , includes nodules with a low likelihood of becoming an active cancer.  Recommendation is for a 6-month repeat screening scan. Often noted after an upper respiratory illness. We will be in touch to make sure you have no questions, and to schedule your 6-month scan.  Lung RADS 4 A: nodules with concerning findings, recommendation is most often for a follow up scan in 3 months or additional testing based on our provider's assessment of the scan. We will be in touch to make sure you have no questions and to schedule the recommended 3 month follow up scan.  Lung RADS 4 B:  indicates findings that are concerning. We will be in touch with you to schedule additional diagnostic testing based on our provider's  assessment of the scan.  Other options for assistance in smoking cessation (   As covered by your insurance benefits)  Hypnosis for smoking cessation  [employer] [phone]  Acupuncture for smoking cessation  [facility] [phone]   

## 2021-10-06 NOTE — Progress Notes (Signed)
Reviewed, agree 

## 2021-10-06 NOTE — Progress Notes (Signed)
Virtual Visit via Telephone Note  I connected with Holly Deleon on 10/06/21 at  9:30 AM EDT by telephone and verified that I am speaking with the correct person using two identifiers.  Location: Patient: At home Provider: [place], [place], [place], [address]    I discussed the limitations, risks, security and privacy concerns of performing an evaluation and management service by telephone and the availability of in person appointments. I also discussed with the patient that there may be a patient responsible charge related to this service. The patient expressed understanding and agreed to proceed.    Shared Decision Making Visit Lung Cancer Screening Program [phone])   Eligibility: Age 56 y.o. Pack Years Smoking History Calculation 32 pack year smoking history (# packs/per year x # years smoked) Recent History of coughing up blood  no Unexplained weight loss? no ( >Than 15 pounds within the last 6 months ) Prior History Lung / other cancer no (Diagnosis within the last 5 years already requiring surveillance chest CT Scans). Smoking Status Former Smoker Former Smokers: Years since quit: < 1 year  Quit Date: 07/15/2021  Visit Components: Discussion included one or more decision making aids. yes Discussion included risk/benefits of screening. yes Discussion included potential follow up diagnostic testing for abnormal scans. yes Discussion included meaning and risk of over diagnosis. yes Discussion included meaning and risk of False Positives. yes Discussion included meaning of total radiation exposure. yes  Counseling Included: Importance of adherence to annual lung cancer LDCT screening. yes Impact of comorbidities on ability to participate in the program. yes Ability and willingness to under diagnostic treatment. yes  Smoking Cessation Counseling: Current Smokers:  Discussed importance of smoking cessation. yes Information about tobacco cessation classes and  interventions provided to patient. yes Patient provided with "ticket" for LDCT Scan. yes Symptomatic Patient. no  Counseling NA Diagnosis Code: Tobacco Use Z72.0 Asymptomatic Patient yes  Counseling (Intermediate counseling: > three minutes counseling) [id number] Former Smokers:  Discussed the importance of maintaining cigarette abstinence. yes Diagnosis Code: Personal History of Nicotine Dependence. F64.332 Information about tobacco cessation classes and interventions provided to patient. Yes Patient provided with "ticket" for LDCT Scan. yes Written Order for Lung Cancer Screening with LDCT placed in Epic. Yes (CT Chest Lung Cancer Screening Low Dose W/O CM) [id number] Z12.2-Screening of respiratory organs Z87.891-Personal history of nicotine dependence  I spent 25 minutes of face to face time/virtual visit time  with  Holly Deleon discussing the risks and benefits of lung cancer screening. We took the time to pause the power point at intervals to allow for questions to be asked and answered to ensure understanding. We discussed that she had taken the single most powerful action possible to decrease her risk of developing lung cancer when she quit smoking. I counseled her to remain smoke free, and to contact me if she ever had the desire to smoke again so that I can provide resources and tools to help support the effort to remain smoke free. We discussed the time and location of the scan, and that either  Doroteo Glassman RN, Joella Prince, RN or I  or I will call / send a letter with the results within  24-72 hours of receiving them. She has the office contact information in the event she needs to speak with me,  she verbalized understanding of all of the above and had no further questions upon leaving the office.     I explained to the patient that there has  been a high incidence of coronary artery disease noted on these exams. I explained that this is a non-gated exam therefore degree or severity cannot  be determined. This patient is not on statin therapy. I have asked the patient to follow-up with their PCP regarding any incidental finding of coronary artery disease and management with diet or medication as they feel is clinically indicated. The patient verbalized understanding of the above and had no further questions.  Pt. States she has gained some weight in the process of quitting smoking. I spoke with her about [facility]. She would like a referral. I will check with Dr. Lake Bells and refer her if he is comfortable with the plan.      Magdalen Spatz, NP 10/06/2021

## 2021-10-08 ENCOUNTER — Ambulatory Visit
Admission: RE | Admit: 2021-10-08 | Discharge: 2021-10-08 | Disposition: A | Discharge status: Home / Self Care | Payer: No Typology Code available for payment source | Source: Ambulatory Visit | Attending: Acute Care | Admitting: Acute Care

## 2021-10-08 DIAGNOSIS — Z122 Encounter for screening for malignant neoplasm of respiratory organs: Secondary | ICD-10-CM

## 2021-10-08 DIAGNOSIS — Z87891 Personal history of nicotine dependence: Secondary | ICD-10-CM

## 2021-10-11 ENCOUNTER — Other Ambulatory Visit: Payer: Self-pay | Admitting: Acute Care

## 2021-10-11 DIAGNOSIS — Z87891 Personal history of nicotine dependence: Secondary | ICD-10-CM

## 2021-10-11 DIAGNOSIS — Z122 Encounter for screening for malignant neoplasm of respiratory organs: Secondary | ICD-10-CM

## 2021-10-14 ENCOUNTER — Ambulatory Visit (INDEPENDENT_AMBULATORY_CARE_PROVIDER_SITE_OTHER): Payer: No Typology Code available for payment source | Admitting: Pulmonary Disease

## 2021-10-14 ENCOUNTER — Encounter: Payer: Self-pay | Admitting: Pulmonary Disease

## 2021-10-14 ENCOUNTER — Telehealth: Payer: Self-pay | Admitting: Pulmonary Disease

## 2021-10-14 VITALS — BP 108/68 | HR 68 | Ht 63.0 in | Wt 151.2 lb

## 2021-10-14 DIAGNOSIS — Z23 Encounter for immunization: Secondary | ICD-10-CM | POA: Diagnosis not present

## 2021-10-14 DIAGNOSIS — G4733 Obstructive sleep apnea (adult) (pediatric): Secondary | ICD-10-CM

## 2021-10-14 DIAGNOSIS — J432 Centrilobular emphysema: Secondary | ICD-10-CM

## 2021-10-14 DIAGNOSIS — Z87891 Personal history of nicotine dependence: Secondary | ICD-10-CM | POA: Diagnosis not present

## 2021-10-14 MED ORDER — IPRATROPIUM-ALBUTEROL 0.5-2.5 (3) MG/3ML IN SOLN: 3.0000 mL | Freq: Four times a day (QID) | RESPIRATORY_TRACT | 5 refills | Status: DC | PRN

## 2021-10-14 NOTE — Progress Notes (Signed)
Synopsis: Referred in August 2023; Emphysema with minimal airflow obstruction. Has smoked cigarettes: 1 ppd for 30 years, quit in July 2023.  Subjective:   PATIENT ID: [name] GENDER: female DOB: 26-Aug-1965, MRN: [id number]   HPI  Chief Complaint  Patient presents with   Follow-up   She says that the Anoro made her feel poorly, and gave her mouth ulcers.  She says she doesn't sleep well.  She still has a lot of fatigue in the afternoons She doesn't sleep well.  She knows she has sleep apnea but she doesn't want to use CPAP. She hasn't slept well in years.   She hasn't yet called community health for their wellness program.   Past Medical History:  Diagnosis Date   Abnormal cervical Papanicolaou smear 02/08/2018   Allergic rhinitis    Asthma    Chronic sinusitis    Herniated cervical disc 03/10/2020   Hyperthyroidism 04/01/2020   IBS (irritable bowel syndrome)    Irritable bowel syndrome 02/08/2018   Palpitations 02/01/2019   Sleep apnea    Syncope and collapse 02/01/2019   Tobacco use 02/01/2019   Vertigo        Review of Systems  Constitutional:  Negative for chills, fever and malaise/fatigue.  HENT:  Negative for congestion and nosebleeds.   Respiratory:  Positive for cough, shortness of breath and wheezing. Negative for sputum production.       Objective:  Physical Exam   Vitals:   10/14/21 1141  BP: 108/68  Pulse: 68  SpO2: 100%  Weight: 151 lb 3.2 oz (68.6 kg)  Height: '5\' 3"'$  (1.6 m)  RA  Gen: well appearing HENT: OP clear, TM's clear, neck supple PULM: CTA B, normal percussion CV: RRR, no mgr, trace edema GI: BS+, soft, nontender Derm: no cyanosis or rash Psyche: normal mood and affect    CBC    Component Value Date/Time   WBC 10.0 01/27/2019 0448   RBC 3.98 01/27/2019 0448   HGB 12.4 01/27/2019 0448   HCT 38.0 01/27/2019 0448   PLT 266 01/27/2019 0448   MCV 95.5 01/27/2019 0448   MCH 31.2 01/27/2019 0448   MCHC 32.6 01/27/2019 0448    RDW 11.9 01/27/2019 0448     Chest imaging: October 2023 low-dose screening CT scan showed upper lobe predominant emphysema, bronchial wall thickening, RADS 1S  PFT: 2019 spirometry performed by asthma and allergy: Ratio 67% FEV1 2.2L 83% pred 2023 spirometry normal Ratio 77% FEV1 2.3L (performed by PCP)  Labs:  Path:  Echo: 03/2019 TTE > LVEF 60-65%, RV size and function normal  Heart Catheterization:       Assessment & Plan:   Need for pneumococcal 20-valent conjugate vaccination - Plan: Pneumococcal conjugate vaccine 20-valent (Prevnar-20)  Centrilobular emphysema ([place])  Former cigarette smoker  OSA (obstructive sleep apnea)  Discussion: Kayia has centrilobular emphysema with very little airflow obstruction but symptoms from the same.  Most of the symptoms she described today are due to untreated sleep apnea.  She is not interested in taking long-acting bronchodilators because they are too expensive.  She is also not interested in treating her sleep apnea.  Plan: Sleep apnea: Practice good sleep hygiene: Minimize any caffeine intake after 12:00 in the middle the day Minimize alcohol consumption Turn all lights off in the bedroom No TV or pets in the bedroom Try to go to bed at the same time every night If you are interested in CPAP treatment let me know  Centrilobular emphysema: Prevnar  vaccine today Stop Anoro Start DuoNeb 3-4 times a day as needed for shortness of breath Practice good hand hygiene Stay physically active  Former cigarette smoker: Keep plans for repeat CT scanning as outlined by the lung cancer screening program  We will see you back in 6 months or sooner if needed    Current Outpatient Medications:    albuterol (VENTOLIN HFA) 108 (90 Base) MCG/ACT inhaler, Inhale 1-2 puffs into the lungs every 6 (six) hours as needed for wheezing or shortness of breath., Disp: , Rfl:    ANORO ELLIPTA 62.5-25 MCG/ACT AEPB, TAKE 1 PUFF BY MOUTH EVERY  DAY, Disp: 60 each, Rfl: 2   dicyclomine (BENTYL) 10 MG capsule, Take 10 mg by mouth 4 (four) times daily -  before meals and at bedtime., Disp: , Rfl:    loratadine (CLARITIN) 10 MG tablet, Take 10 mg by mouth daily., Disp: , Rfl:

## 2021-10-14 NOTE — Patient Instructions (Signed)
Sleep apnea: Practice good sleep hygiene: Minimize any caffeine intake after 12:00 in the middle the day Minimize alcohol consumption Turn all lights off in the bedroom No TV or pets in the bedroom Try to go to bed at the same time every night If you are interested in CPAP treatment let me know  Centrilobular emphysema: Prevnar vaccine today Stop Anoro Start DuoNeb 3-4 times a day as needed for shortness of breath Practice good hand hygiene Stay physically active  Former cigarette smoker: Keep plans for repeat CT scanning as outlined by the lung cancer screening program  We will see you back in 6 months or sooner if needed

## 2021-10-15 NOTE — Telephone Encounter (Signed)
Holly Doom, MD  You 5 minutes ago (9:26 AM)    OK, she needs to tell us where she had her sleep study so we can request those records     Called and spoke with pt letting her know the info from BQ. Pt said that she would try to get a copy of her prior sleep study to send to [place].

## 2021-10-15 NOTE — Telephone Encounter (Signed)
Called and spoke with pt who states she is interested in CPAP treatment now. Routing to BQ.

## 2021-10-19 ENCOUNTER — Ambulatory Visit: Payer: No Typology Code available for payment source | Admitting: Cardiology

## 2021-10-27 ENCOUNTER — Encounter: Payer: Self-pay | Admitting: Pulmonary Disease

## 2021-10-27 NOTE — Telephone Encounter (Signed)
Checked with Holly Deleon to see if she had seen a study on pt yet and she said nothing has come yet. Routing encounter to [place] so we can keep an eye out on this for pt.  Pt did state if we hadn't received anything by the end of the week to let her know and she would reach back out to the sleep clinic.

## 2021-11-01 ENCOUNTER — Encounter: Payer: Self-pay | Admitting: Pulmonary Disease

## 2021-11-01 NOTE — Telephone Encounter (Signed)
Holly Deleon have you seen this sleep study?

## 2021-11-18 NOTE — Telephone Encounter (Signed)
We still have not received the sleep study from [facility] after multiple attempts faxing the release of records to them. Pt wants to know if the sleep study should just be repeated due to not receiving this from them as she said that she is not sleeping.  Dr. Lake Bells, please advise on this for pt.

## 2021-11-22 ENCOUNTER — Encounter: Payer: Self-pay | Admitting: Cardiology

## 2021-11-22 ENCOUNTER — Encounter: Payer: Self-pay | Admitting: Pulmonary Disease

## 2021-11-22 ENCOUNTER — Ambulatory Visit: Payer: No Typology Code available for payment source | Attending: Cardiology | Admitting: Cardiology

## 2021-11-22 VITALS — BP 136/84 | HR 64 | Ht 63.0 in | Wt 158.2 lb

## 2021-11-22 DIAGNOSIS — R5383 Other fatigue: Secondary | ICD-10-CM | POA: Diagnosis not present

## 2021-11-22 DIAGNOSIS — E785 Hyperlipidemia, unspecified: Secondary | ICD-10-CM | POA: Diagnosis not present

## 2021-11-22 DIAGNOSIS — Z131 Encounter for screening for diabetes mellitus: Secondary | ICD-10-CM

## 2021-11-22 DIAGNOSIS — R0609 Other forms of dyspnea: Secondary | ICD-10-CM | POA: Diagnosis not present

## 2021-11-22 DIAGNOSIS — E0590 Thyrotoxicosis, unspecified without thyrotoxic crisis or storm: Secondary | ICD-10-CM

## 2021-11-22 MED ORDER — METOPROLOL TARTRATE 50 MG PO TABS: 50.0000 mg | ORAL_TABLET | Freq: Once | ORAL | 0 refills | Status: DC

## 2021-11-22 NOTE — Patient Instructions (Signed)
Medication Instructions:  Your physician recommends that you continue on your current medications as directed. Please refer to the Current Medication list given to you today.  *If you need a refill on your cardiac medications before your next appointment, please call your pharmacy*   Lab Work: Your physician recommends that you have the following labs drawn today in office: BMET, Magnesium, Lipid profile, CBC, TSH, Hemoglobin A1c and Vitamin D level.   If you have labs (blood work) drawn today and your tests are completely normal, you will receive your results only by: [place] (if you have MyChart) OR A paper copy in the mail If you have any lab test that is abnormal or we need to change your treatment, we will call you to review the results.   Testing/Procedures: Cardiac CT Angiography (CTA), is a special type of CT scan that uses a computer to produce multi-dimensional views of major blood vessels throughout the body. In CT angiography, a contrast material is injected through an IV to help visualize the blood vessels    Follow-Up: At [facility], you and your health needs are our priority.  As part of our continuing mission to provide you with exceptional heart care, we have created designated Provider Care Teams.  These Care Teams include your primary Cardiologist (physician) and Advanced Practice Providers (APPs -  Physician Assistants and Nurse Practitioners) who all work together to provide you with the care you need, when you need it.  We recommend signing up for the patient portal called "MyChart".  Sign up information is provided on this After Visit Summary.  MyChart is used to connect with patients for Virtual Visits (Telemedicine).  Patients are able to view lab/test results, encounter notes, upcoming appointments, etc.  Non-urgent messages can be sent to your provider as well.   To learn more about what you can do with MyChart, go to [email or web address].     Your next appointment:   3 month(s)  The format for your next appointment:   In Person  Provider:   Berniece Salines, DO     Other Instructions   Your cardiac CT will be scheduled at one of the below locations:   [facility] [address] [place], [place] [address] [phone]  If scheduled at [facility], please arrive at the [facility] En[facility]ance (Entrance C2) of [facility] 30 minutes prior to test start time. You can use the FREE valet parking offered at entrance C (encouraged to control the heart rate for the test)  Proceed to the [facility] Radiology Department (first floor) to check-in and test prep.  All radiology patients and guests should use entrance C2 at [facility], accessed from [facility], even though the hospital's physical address listed is [address]    On the Night Before the Test: Be sure to Drink plenty of water. Do not consume any caffeinated/decaffeinated beverages or chocolate 12 hours prior to your test. Do not take any antihistamines 12 hours prior to your test.  On the Day of the Test: Drink plenty of water until 1 hour prior to the test. Do not eat any food 1 hour prior to test. You may take your regular medications prior to the test.  Take metoprolol (Lopressor) '50mg'$   two hours prior to test. HOLD Furosemide/Hydrochlorothiazide morning of the test. FEMALES- please wear underwire-free bra if available, avoid dresses & tight clothing       After the Test:  Drink plenty of water. After receiving IV contrast, you may experience a mild flushed feeling. This is normal. On occasion, you may experience a mild rash up to 24 hours after the test. This is not dangerous. If this occurs, you can take Benadryl 25 mg and increase your fluid intake. If you experience trouble breathing, this can be serious. If it is severe call 911 IMMEDIATELY. If it is mild, please call our office. If  you take any of these medications: Glipizide/Metformin, Avandament, Glucavance, please do not take 48 hours after completing test unless otherwise instructed.  We will call to schedule your test 2-4 weeks out understanding that some insurance companies will need an authorization prior to the service being performed.   For non-scheduling related questions, please contact the cardiac imaging nurse navigator should you have any questions/concerns: Marchia Bond, Cardiac Imaging Nurse Navigator Gordy Clement, Cardiac Imaging Nurse Navigator [facility] Direct Office Dial: [phone]   For scheduling needs, including cancellations and rescheduling, please call [place], [phone].    Mediterranean Diet A Mediterranean diet refers to food and lifestyle choices that are based on the traditions of countries located on the [employer]. It focuses on eating more fruits, vegetables, whole grains, beans, nuts, seeds, and heart-healthy fats, and eating less dairy, meat, eggs, and processed foods with added sugar, salt, and fat. This way of eating has been shown to help prevent certain conditions and improve outcomes for people who have chronic diseases, like kidney disease and heart disease. What are tips for following this plan? Reading food labels Check the serving size of packaged foods. For foods such as rice and pasta, the serving size refers to the amount of cooked product, not dry. Check the total fat in packaged foods. Avoid foods that have saturated fat or trans fats. Check the ingredient list for added sugars, such as corn syrup. Shopping  Buy a variety of foods that offer a balanced diet, including: Fresh fruits and vegetables (produce). Grains, beans, nuts, and seeds. Some of these may be available in unpackaged forms or large amounts (in bulk). Fresh seafood. Poultry and eggs. Low-fat dairy products. Buy whole ingredients instead of prepackaged  foods. Buy fresh fruits and vegetables in-season from local farmers markets. Buy plain frozen fruits and vegetables. If you do not have access to quality fresh seafood, buy precooked frozen shrimp or canned fish, such as tuna, salmon, or sardines. Stock your pantry so you always have certain foods on hand, such as olive oil, canned tuna, canned tomatoes, rice, pasta, and beans. Cooking Cook foods with extra-virgin olive oil instead of using butter or other vegetable oils. Have meat as a side dish, and have vegetables or grains as your main dish. This means having meat in small portions or adding small amounts of meat to foods like pasta or stew. Use beans or vegetables instead of meat in common dishes like chili or lasagna. Experiment with different cooking methods. Try roasting, broiling, steaming, and sauting vegetables. Add frozen vegetables to soups, stews, pasta, or rice. Add nuts or seeds for added healthy fats and plant protein at each meal. You can add these to yogurt, salads, or vegetable dishes. Marinate fish or vegetables using olive oil, lemon juice, garlic, and fresh herbs. Meal planning Plan to eat one vegetarian meal one day each week. Try to work up to two vegetarian meals, if possible. Eat seafood two or more times a week. Have healthy snacks readily available, such as: Vegetable sticks with  hummus. Greek yogurt. Fruit and nut trail mix. Eat balanced meals throughout the week. This includes: Fruit: 2-3 servings a day. Vegetables: 4-5 servings a day. Low-fat dairy: 2 servings a day. Fish, poultry, or lean meat: 1 serving a day. Beans and legumes: 2 or more servings a week. Nuts and seeds: 1-2 servings a day. Whole grains: 6-8 servings a day. Extra-virgin olive oil: 3-4 servings a day. Limit red meat and sweets to only a few servings a month. Lifestyle  Cook and eat meals together with your family, when possible. Drink enough fluid to keep your urine pale yellow. Be  physically active every day. This includes: Aerobic exercise like running or swimming. Leisure activities like gardening, walking, or housework. Get 7-8 hours of sleep each night. If recommended by your health care provider, drink red wine in moderation. This means 1 glass a day for nonpregnant women and 2 glasses a day for men. A glass of wine equals 5 oz (150 mL). What foods should I eat? Fruits Apples. Apricots. Avocado. Berries. Bananas. Cherries. Dates. Figs. Grapes. Lemons. Melon. Oranges. Peaches. Plums. Pomegranate. Vegetables Artichokes. Beets. Broccoli. Cabbage. Carrots. Eggplant. Green beans. Chard. Kale. Spinach. Onions. Leeks. Peas. Squash. Tomatoes. Peppers. Radishes. Grains Whole-grain pasta. Brown rice. Bulgur wheat. Polenta. Couscous. Whole-wheat bread. [place]. Meats and other proteins Beans. Almonds. Sunflower seeds. Pine nuts. Peanuts. [place]. Salmon. Scallops. Shrimp. [place]. Tilapia. Clams. Oysters. Eggs. Poultry without skin. Dairy Low-fat milk. Cheese. Greek yogurt. Fats and oils Extra-virgin olive oil. Avocado oil. Grapeseed oil. Beverages Water. Red wine. Herbal tea. Sweets and desserts Greek yogurt with honey. Baked apples. Poached pears. Trail mix. Seasonings and condiments Basil. Cilantro. Coriander. Cumin. Mint. Parsley. Sage. Rosemary. Tarragon. Garlic. Oregano. Thyme. Pepper. Balsamic vinegar. Tahini. Hummus. Tomato sauce. Olives. Mushrooms. The items listed above may not be a complete list of foods and beverages you can eat. Contact a dietitian for more information. What foods should I limit? This is a list of foods that should be eaten rarely or only on special occasions. Fruits Fruit canned in syrup. Vegetables Deep-fried potatoes (french fries). Grains Prepackaged pasta or rice dishes. Prepackaged cereal with added sugar. Prepackaged snacks with added sugar. Meats and other proteins Beef. Pork. Lamb. Poultry with skin. Hot dogs.  Berniece Salines. Dairy Ice cream. Sour cream. Whole milk. Fats and oils Butter. Canola oil. Vegetable oil. Beef fat (tallow). Lard. Beverages Juice. Sugar-sweetened soft drinks. Beer. Liquor and spirits. Sweets and desserts Cookies. Cakes. Pies. Candy. Seasonings and condiments Mayonnaise. Pre-made sauces and marinades. The items listed above may not be a complete list of foods and beverages you should limit. Contact a dietitian for more information. Summary The Mediterranean diet includes both food and lifestyle choices. Eat a variety of fresh fruits and vegetables, beans, nuts, seeds, and whole grains. Limit the amount of red meat and sweets that you eat. If recommended by your health care provider, drink red wine in moderation. This means 1 glass a day for nonpregnant women and 2 glasses a day for men. A glass of wine equals 5 oz (150 mL). This information is not intended to replace advice given to you by your health care provider. Make sure you discuss any questions you have with your health care provider. Document Revised: 01/25/2019 Document Reviewed: 11/22/2018 Elsevier Patient Education  [place]

## 2021-11-22 NOTE — Progress Notes (Signed)
Cardiology Office Note:    Date:  11/22/2021   ID:  Holly Deleon, DOB [birth year], MRN [id number]  PCP:  Angelina Sheriff, MD  Cardiologist:  Berniece Salines, DO  Electrophysiologist:  None   Referring MD: Angelina Sheriff, MD   " I am   History of Present Illness:    Holly Deleon is a 56 y.o. female with a hx of IBS, vertigo, hyperlipidemia, recently quit smoking after 30 years, coronary sclerosis seen on lung CT scan here today for follow-up visit.  I saw the patient in May 2021 at that time it was in follow-up to review her testing that was done.  She had had a syncope episode.  Her monitor as well as her echo were normal.  Today she presented to be seen because she has had symptoms recently that is concerning to her.  She described to me that now she is experiencing some shortness of breath on exertion.  She notes that the shortness of breath really does not happen at rest but mostly when she is up and about and moving.  What usually happen at rest and the fact that she is significantly fatigued.  She is concerned about this.  Past Medical History:  Diagnosis Date   Abnormal cervical Papanicolaou smear 02/08/2018   Allergic rhinitis    Asthma    Chronic sinusitis    Herniated cervical disc 03/10/2020   Hyperthyroidism 04/01/2020   IBS (irritable bowel syndrome)    Irritable bowel syndrome 02/08/2018   Palpitations 02/01/2019   Sleep apnea    Syncope and collapse 02/01/2019   Tobacco use 02/01/2019   Vertigo     Past Surgical History:  Procedure Laterality Date   COLONOSCOPY  01/22/2016   Colon Polyp(s). Internal hemorrhoids   LAPAROSCOPIC HYSTERECTOMY     SINOSCOPY      Current Medications: Current Meds  Medication Sig   albuterol (VENTOLIN HFA) 108 (90 Base) MCG/ACT inhaler Inhale 1-2 puffs into the lungs every 6 (six) hours as needed for wheezing or shortness of breath.   dicyclomine (BENTYL) 10 MG capsule Take 10 mg by mouth 4 (four) times daily -  before meals and  at bedtime.   ipratropium-albuterol (DUONEB) 0.5-2.5 (3) MG/3ML SOLN Take 3 mLs by nebulization every 6 (six) hours as needed.   loratadine (CLARITIN) 10 MG tablet Take 10 mg by mouth daily.     Allergies:   Anoro ellipta [umeclidinium-vilanterol] and Penicillins   Social History   Socioeconomic History   Marital status: Single    Spouse name: Not on file   Number of children: Not on file   Years of education: Not on file   Highest education level: Not on file  Occupational History   Not on file  Tobacco Use   Smoking status: Former    Packs/day: 1.00    Types: Cigarettes    Quit date: 07/15/2021    Years since quitting: 0.3   Smokeless tobacco: Never  Vaping Use   Vaping Use: Never used  Substance and Sexual Activity   Alcohol use: Yes    Comment: Rare, special occasion   Drug use: Not Currently   Sexual activity: Not on file  Other Topics Concern   Not on file  Social History Narrative   Not on file   Social Determinants of Health   Financial Resource Strain: Not on file  Food Insecurity: Not on file  Transportation Needs: Not on file  Physical Activity: Not on file  Stress: Not on file  Social Connections: Not on file     Family History: The patient's family history includes Allergic rhinitis in her mother and son; Arthritis in her mother; Colon cancer in her father; Heart attack in her maternal grandfather and maternal uncle; High Cholesterol in her father; Irritable bowel syndrome in her maternal grandmother, mother, and niece; Stroke in her maternal aunt and maternal grandmother.  ROS:   Review of Systems  Constitution: Negative for decreased appetite, fever and weight gain.  HENT: Negative for congestion, ear discharge, hoarse voice and sore throat.   Eyes: Negative for discharge, redness, vision loss in right eye and visual halos.  Cardiovascular: Negative for chest pain, dyspnea on exertion, leg swelling, orthopnea and palpitations.  Respiratory:  Negative for cough, hemoptysis, shortness of breath and snoring.   Endocrine: Negative for heat intolerance and polyphagia.  Hematologic/Lymphatic: Negative for bleeding problem. Does not bruise/bleed easily.  Skin: Negative for flushing, nail changes, rash and suspicious lesions.  Musculoskeletal: Negative for arthritis, joint pain, muscle cramps, myalgias, neck pain and stiffness.  Gastrointestinal: Negative for abdominal pain, bowel incontinence, diarrhea and excessive appetite.  Genitourinary: Negative for decreased libido, genital sores and incomplete emptying.  Neurological: Negative for brief paralysis, focal weakness, headaches and loss of balance.  Psychiatric/Behavioral: Negative for altered mental status, depression and suicidal ideas.  Allergic/Immunologic: Negative for HIV exposure and persistent infections.    EKGs/Labs/Other Studies Reviewed:    The following studies were reviewed today:   EKG:  The ekg ordered today demonstrates sinus rhythm, heart rate 80 beats minute with nonspecific ST changes.   Zio monitor  The patient wore the monitor for 6 days 23 hours starting February 01, 2019. Indication: Syncope The minimum heart rate was 53 bpm, maximum heart rate was 118 bpm, and average heart rate was 80 bpm. Predominant underlying rhythm was Sinus Rhythm. Premature atrial complexes were rare, less than 1%. Premature Ventricular complexes rare, less than 1%. No ventricular tachycardia, pauses, No AV block, no supraventricular tachycardia and no atrial fibrillation present. No patient triggered events noted. Conclusion: Normal/unremarkable study.   03/21/2019 IMPRESSIONS  1. Left ventricular ejection fraction, by estimation, is 60 to 65%. The left ventricle has normal function. The left ventricle has no regional wall motion abnormalities. Left ventricular diastolic parameters were normal.   2. Right ventricular systolic function is normal. The right ventricular size is  normal.   3. The mitral valve is normal in structure. Trivial mitral valve regurgitation. No evidence of mitral stenosis.   4. The aortic valve is normal in structure. Aortic valve regurgitation is not visualized. No aortic stenosis is present.   5. The inferior vena cava is normal in size with greater than 50% respiratory variability, suggesting right atrial pressure of 3 mmHg.   Recent Labs: No results found for requested labs within last 365 days.  Recent Lipid Panel No results found for: "CHOL", "TRIG", "HDL", "CHOLHDL", "VLDL", "LDLCALC", "LDLDIRECT"  Physical Exam:    VS:  BP 136/84   Pulse 64   Ht '5\' 3"'$  (1.6 m)   Wt 158 lb 3.2 oz (71.8 kg)   SpO2 99%   BMI 28.02 kg/m     Wt Readings from Last 3 Encounters:  11/22/21 158 lb 3.2 oz (71.8 kg)  10/14/21 151 lb 3.2 oz (68.6 kg)  08/18/21 151 lb 12.8 oz (68.9 kg)     GEN: Well nourished, well developed in no acute distress HEENT: Normal NECK: No JVD; No carotid bruits LYMPHATICS:  No lymphadenopathy CARDIAC: S1S2 noted,RRR, no murmurs, rubs, gallops RESPIRATORY:  Clear to auscultation without rales, wheezing or rhonchi  ABDOMEN: Soft, non-tender, non-distended, +bowel sounds, no guarding. EXTREMITIES: No edema, No cyanosis, no clubbing MUSCULOSKELETAL:  No deformity  SKIN: Warm and dry NEUROLOGIC:  Alert and oriented x 3, non-focal PSYCHIATRIC:  Normal affect, good insight  ASSESSMENT:    1. DOE (dyspnea on exertion)   2. Fatigue, unspecified type   3. Hyperlipidemia, unspecified hyperlipidemia type   4. Diabetes mellitus screening    PLAN:    The symptoms dyspnea on exertion is concerning, this patient does have intermediate risk for coronary artery disease and at this time I would like to pursue an ischemic evaluation in this patient.  Shared decision a coronary CTA at this time is appropriate.  I have discussed with the patient about the testing.  The patient has no IV contrast allergy and is agreeable to proceed  with this test.  For fatigue we will get blood work she has not gotten blood work in a while so CBC, BMP, mag TSH as well as vitamin D will be done as well.  Her lipid profile in 2020 showed significant hyperlipidemia LDL 146, triglyceride 126, HDL 63, total cholesterol 234.  We will get lipid profile this morning.  The patient is in agreement with the above plan. The patient left the office in stable condition.  The patient will follow up in 3 months or sooner if needed.   Medication Adjustments/Labs and Tests Ordered: Current medicines are reviewed at length with the patient today.  Concerns regarding medicines are outlined above.  No orders of the defined types were placed in this encounter.  No orders of the defined types were placed in this encounter.   There are no Patient Instructions on file for this visit.   Adopting a Healthy Lifestyle.  Know what a healthy weight is for you (roughly BMI <25) and aim to maintain this   Aim for 7+ servings of fruits and vegetables daily   65-80+ fluid ounces of water or unsweet tea for healthy kidneys   Limit to max 1 drink of alcohol per day; avoid smoking/tobacco   Limit animal fats in diet for cholesterol and heart health - choose grass fed whenever available   Avoid highly processed foods, and foods high in saturated/trans fats   Aim for low stress - take time to unwind and care for your mental health   Aim for 150 min of moderate intensity exercise weekly for heart health, and weights twice weekly for bone health   Aim for 7-9 hours of sleep daily   When it comes to diets, agreement about the perfect plan isnt easy to find, even among the experts. Experts at the [employer] developed an idea known as the Healthy Eating Plate. Just imagine a plate divided into logical, healthy portions.   The emphasis is on diet quality:   Load up on vegetables and fruits - one-half of your plate: Aim for color and variety,  and remember that potatoes dont count.   Go for whole grains - one-quarter of your plate: Whole wheat, barley, wheat berries, quinoa, oats, brown rice, and foods made with them. If you want pasta, go with whole wheat pasta.   Protein power - one-quarter of your plate: Fish, chicken, beans, and nuts are all healthy, versatile protein sources. Limit red meat.   The diet, however, does go beyond the plate, offering a few other  suggestions.   Use healthy plant oils, such as olive, canola, soy, corn, sunflower and peanut. Check the labels, and avoid partially hydrogenated oil, which have unhealthy trans fats.   If youre thirsty, drink water. Coffee and tea are good in moderation, but skip sugary drinks and limit milk and dairy products to one or two daily servings.   The type of carbohydrate in the diet is more important than the amount. Some sources of carbohydrates, such as vegetables, fruits, whole grains, and beans-are healthier than others.   Finally, stay active  Signed, Berniece Salines, DO  11/22/2021 8:30 AM    [facility]

## 2021-11-23 ENCOUNTER — Other Ambulatory Visit: Payer: Self-pay

## 2021-11-23 LAB — BASIC METABOLIC PANEL
BUN/Creatinine Ratio: 24 — ABNORMAL HIGH (ref 9–23)
BUN: 17 mg/dL (ref 6–24)
CO2: 23 mmol/L (ref 20–29)
Calcium: 9.2 mg/dL (ref 8.7–10.2)
Chloride: 102 mmol/L (ref 96–106)
Creatinine, Ser: 0.7 mg/dL (ref 0.57–1.00)
Glucose: 96 mg/dL (ref 70–99)
Potassium: 4.2 mmol/L (ref 3.5–5.2)
Sodium: 139 mmol/L (ref 134–144)
eGFR: 102 mL/min/{1.73_m2} (ref >59)

## 2021-11-23 LAB — CBC
Hematocrit: 36.9 % (ref 34.0–46.6)
Hemoglobin: 12.4 g/dL (ref 11.1–15.9)
MCH: 30.6 pg (ref 26.6–33.0)
MCHC: 33.6 g/dL (ref 31.5–35.7)
MCV: 91 fL (ref 79–97)
Platelets: 271 10*3/uL (ref 150–450)
RBC: 4.05 x10E6/uL (ref 3.77–5.28)
RDW: 11.4 % — ABNORMAL LOW (ref 11.7–15.4)
WBC: 6.5 10*3/uL (ref 3.4–10.8)

## 2021-11-23 LAB — HEMOGLOBIN A1C
Est. average glucose Bld gHb Est-mCnc: 120 mg/dL
Hgb A1c MFr Bld: 5.8 % — ABNORMAL HIGH (ref 4.8–5.6)

## 2021-11-23 LAB — TSH: TSH: 1.49 u[IU]/mL (ref 0.450–4.500)

## 2021-11-23 LAB — LIPID PANEL
Chol/HDL Ratio: 3.8 ratio (ref 0.0–4.4)
Cholesterol, Total: 244 mg/dL — ABNORMAL HIGH (ref 100–199)
HDL: 65 mg/dL (ref >39)
LDL Chol Calc (NIH): 154 mg/dL — ABNORMAL HIGH (ref 0–99)
Triglycerides: 143 mg/dL (ref 0–149)
VLDL Cholesterol Cal: 25 mg/dL (ref 5–40)

## 2021-11-23 LAB — VITAMIN D 25 HYDROXY (VIT D DEFICIENCY, FRACTURES): Vit D, 25-Hydroxy: 27.9 ng/mL — ABNORMAL LOW (ref 30.0–100.0)

## 2021-11-23 LAB — MAGNESIUM: Magnesium: 2.2 mg/dL (ref 1.6–2.3)

## 2021-11-23 MED ORDER — ROSUVASTATIN CALCIUM 5 MG PO TABS: 5.0000 mg | ORAL_TABLET | Freq: Every day | ORAL | 3 refills | Status: DC

## 2021-11-23 MED ORDER — VITAMIN D (ERGOCALCIFEROL) 1.25 MG (50000 UNIT) PO CAPS: 50000.0000 [IU] | ORAL_CAPSULE | ORAL | 0 refills | Status: DC

## 2021-11-24 NOTE — Addendum Note (Signed)
Addended by: Hinton Dyer on: 11/24/2021 09:00 AM   Modules accepted: Orders

## 2021-11-30 ENCOUNTER — Encounter: Payer: Self-pay | Admitting: Cardiology

## 2021-12-03 ENCOUNTER — Telehealth (HOSPITAL_COMMUNITY): Payer: Self-pay | Admitting: Emergency Medicine

## 2021-12-03 NOTE — Telephone Encounter (Signed)
Reaching out to patient to offer assistance regarding upcoming cardiac imaging study; pt verbalizes understanding of appt date/time, parking situation and where to check in, pre-test NPO status and medications ordered, and verified current allergies; name and call back number provided for further questions should they arise Marchia Bond RN Navigator Cardiac Imaging [facility] [phone] office [phone] cell  Arrival 300  '50mg'$  metoprolol tartrate Denies iv issues

## 2021-12-06 ENCOUNTER — Ambulatory Visit (HOSPITAL_COMMUNITY)
Admission: RE | Admit: 2021-12-06 | Discharge: 2021-12-06 | Disposition: A | Discharge status: Home / Self Care | Payer: PRIVATE HEALTH INSURANCE | Source: Ambulatory Visit | Attending: Cardiology | Admitting: Cardiology

## 2021-12-06 ENCOUNTER — Ambulatory Visit (HOSPITAL_BASED_OUTPATIENT_CLINIC_OR_DEPARTMENT_OTHER)
Admission: RE | Admit: 2021-12-06 | Discharge: 2021-12-06 | Disposition: A | Discharge status: Home / Self Care | Payer: PRIVATE HEALTH INSURANCE | Source: Ambulatory Visit | Attending: Internal Medicine | Admitting: Internal Medicine

## 2021-12-06 DIAGNOSIS — R0609 Other forms of dyspnea: Secondary | ICD-10-CM

## 2021-12-06 MED ORDER — NITROGLYCERIN 0.4 MG SL SUBL
SUBLINGUAL_TABLET | SUBLINGUAL | Status: AC
  Filled 2021-12-06: qty 2

## 2021-12-06 MED ORDER — NITROGLYCERIN 0.4 MG SL SUBL
0.8000 mg | SUBLINGUAL_TABLET | Freq: Once | SUBLINGUAL | Status: AC
  Administered 2021-12-06: 0.8 mg via SUBLINGUAL

## 2021-12-06 MED ORDER — IOHEXOL 350 MG/ML SOLN
95.0000 mL | Freq: Once | INTRAVENOUS | Status: AC | PRN
  Administered 2021-12-06: 95 mL via INTRAVENOUS

## 2021-12-08 ENCOUNTER — Encounter: Payer: Self-pay | Admitting: Cardiology

## 2021-12-23 ENCOUNTER — Encounter: Payer: Self-pay | Admitting: Cardiology

## 2021-12-23 ENCOUNTER — Ambulatory Visit: Payer: Commercial Managed Care - HMO | Attending: Cardiology | Admitting: Cardiology

## 2021-12-23 VITALS — BP 110/68 | HR 82 | Ht 63.0 in | Wt 152.4 lb

## 2021-12-23 DIAGNOSIS — I2510 Atherosclerotic heart disease of native coronary artery without angina pectoris: Secondary | ICD-10-CM | POA: Diagnosis not present

## 2021-12-23 DIAGNOSIS — Q245 Malformation of coronary vessels: Secondary | ICD-10-CM

## 2021-12-23 DIAGNOSIS — E785 Hyperlipidemia, unspecified: Secondary | ICD-10-CM

## 2021-12-23 IMAGING — US US AXILLARY LEFT
1 series · 7 of 7 positions shown · non-contrast
Comparison: Previous exams.

CLINICAL DATA: 53-year-old female presenting as a recall from
screening for prominent left axillary lymph node.

EXAM:
ULTRASOUND OF THE LEFT AXILLA

[Series 1: us axillary left · 0.07mm/px · 7 of 7 slices shown]
[im 1/7]
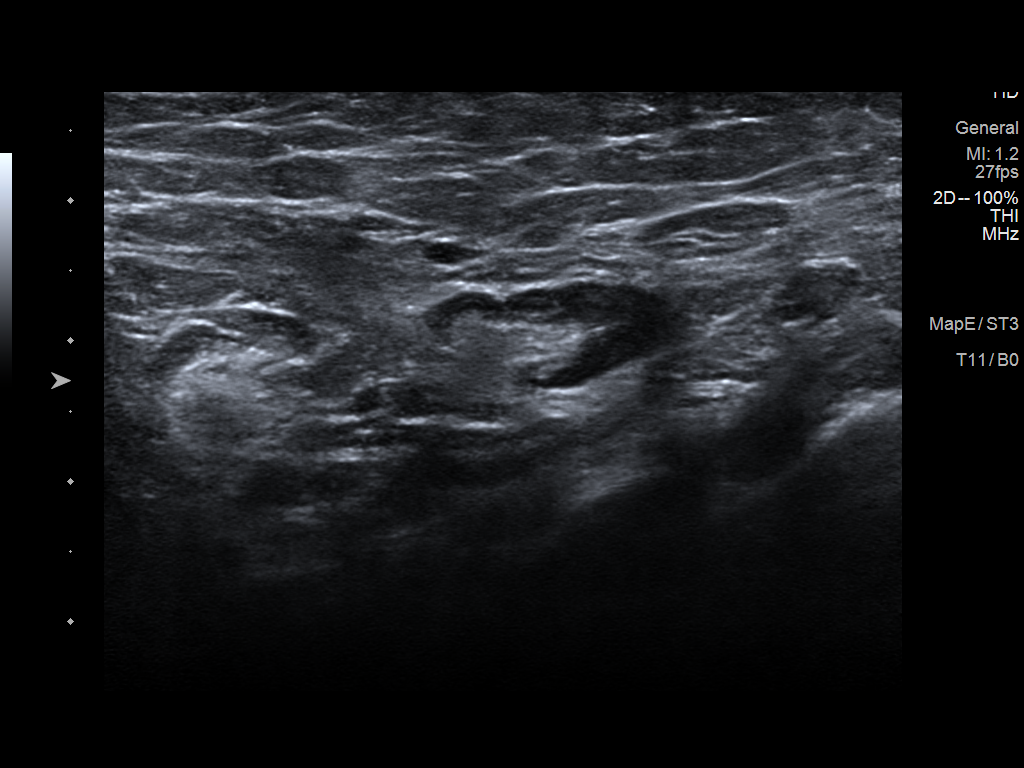
[im 2/7]
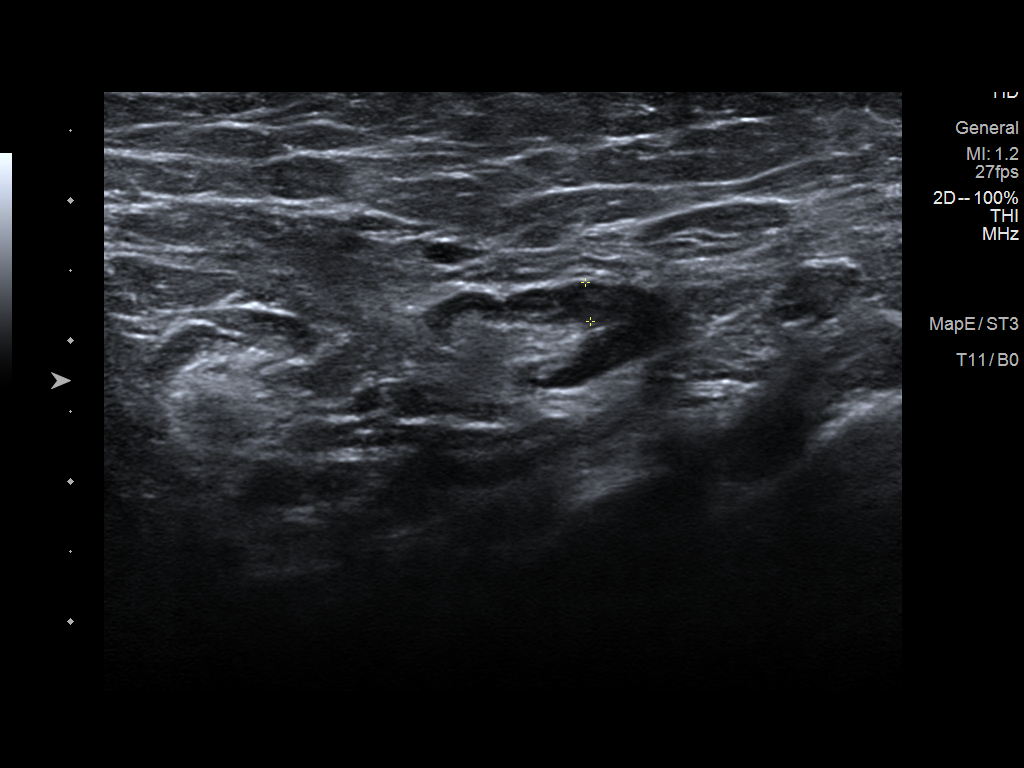
[im 3/7]
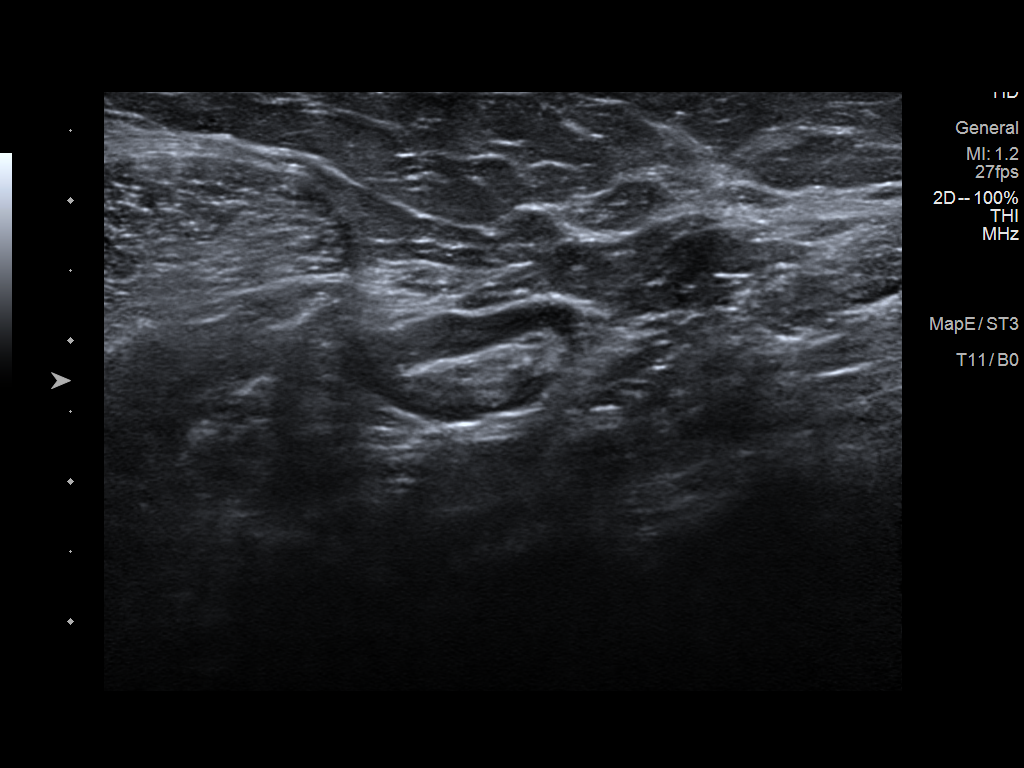
[im 4/7]
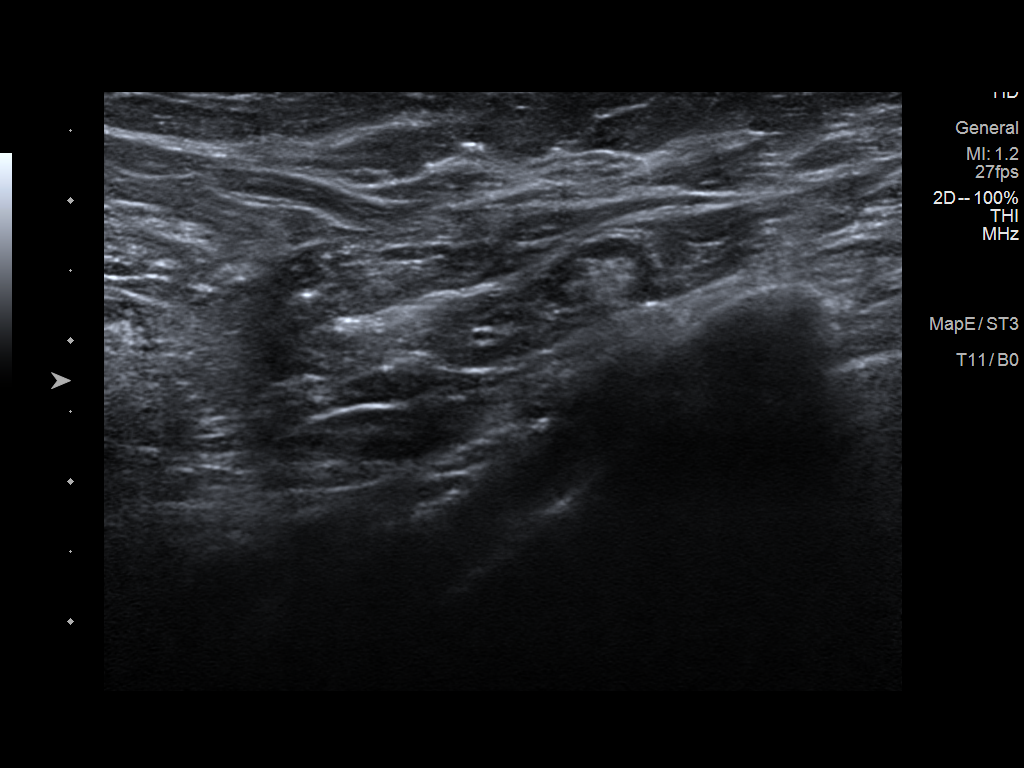
[im 5/7]
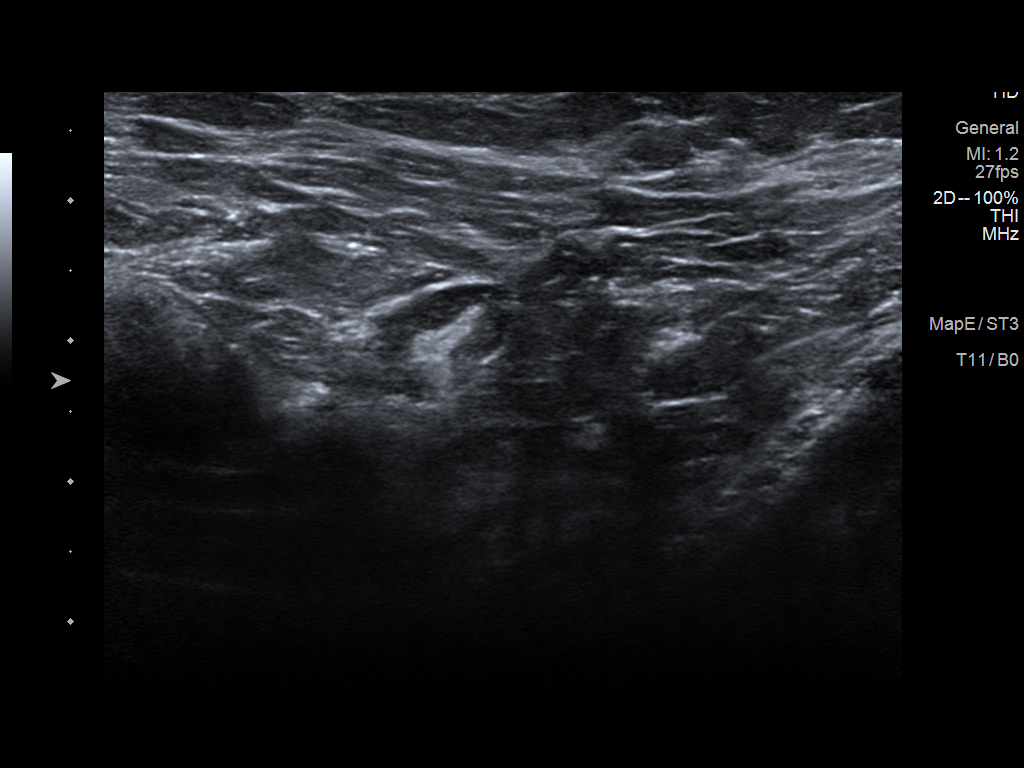
[im 6/7]
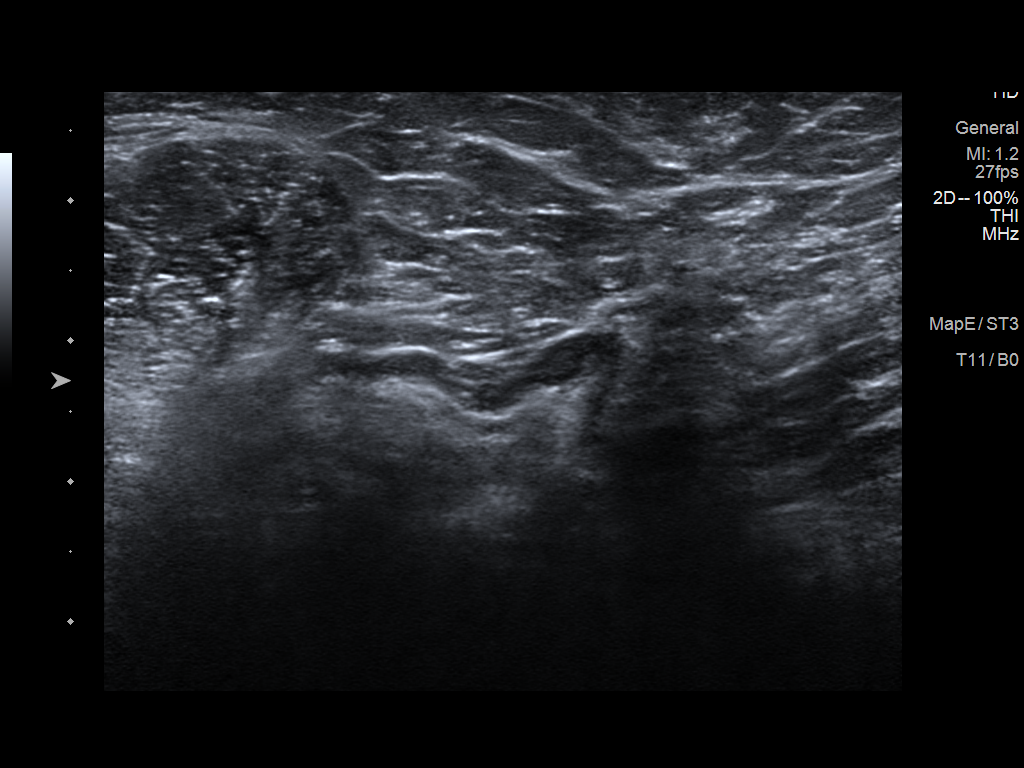
[im 7/7]
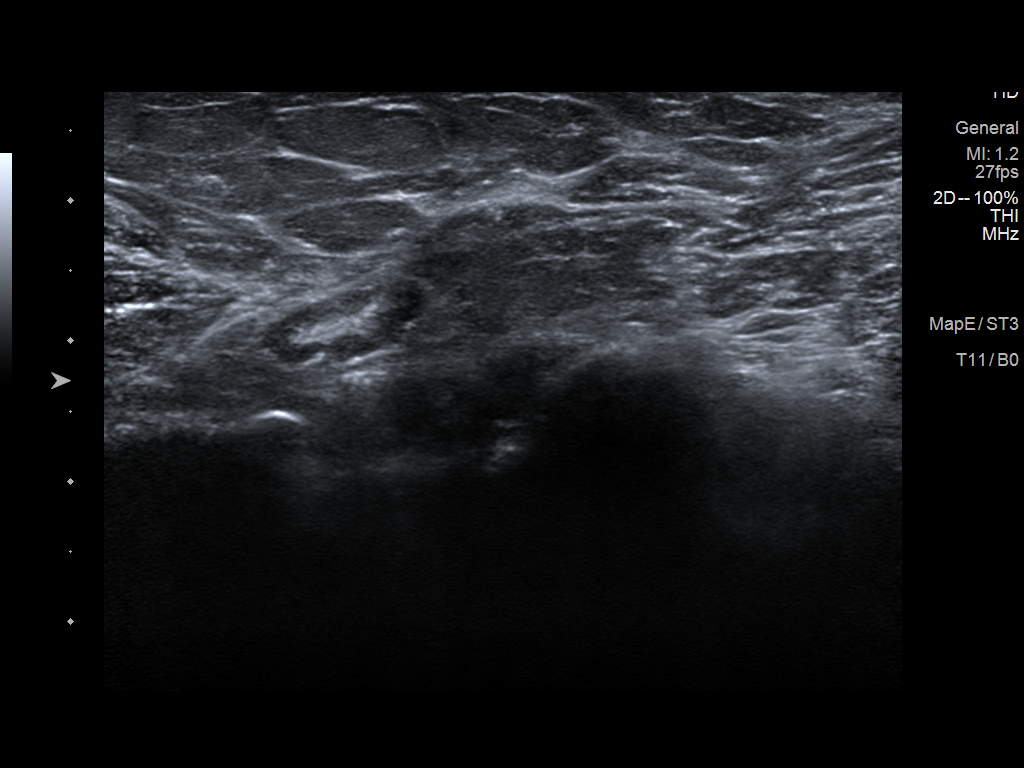

[7 of 7 positions shown; findings below may reference images not displayed]

FINDINGS: Ultrasound is performed in the left axilla demonstrating multiple
normal appearing lymph nodes with cortical thickness up to 0.3 cm.
There is no morphologically abnormal lymph node or lymph node with
increased cortical thickness. No suspicious solid mass.
IMPRESSION: Normal lymph nodes in the left axilla. No sonographic evidence of
malignancy.

RECOMMENDATION:
Screening mammogram in one year.(Code:[id number]-B-Z61)

I have discussed the findings and recommendations with the patient.
If applicable, a reminder letter will be sent to the patient
regarding the next appointment.

BI-RADS CATEGORY  1: Negative.

## 2021-12-23 NOTE — Progress Notes (Signed)
Cardiology Office Note:    Date:  12/25/2021   ID:  [name], DOB [birth year], MRN [id number]  PCP:  Angelina Sheriff, MD  Cardiologist:  Berniece Salines, DO  Electrophysiologist:  None   Referring MD: Angelina Sheriff, MD   " I am ok"  History of Present Illness:    Holly Deleon is a 56 y.o. 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  She is here today to discuss her results.    Past Medical History:  Diagnosis Date   Abnormal cervical Papanicolaou smear 02/08/2018   Allergic rhinitis    Asthma    Chronic sinusitis    Herniated cervical disc 03/10/2020   Hyperthyroidism 04/01/2020   IBS (irritable bowel syndrome)    Irritable bowel syndrome 02/08/2018   Palpitations 02/01/2019   Sleep apnea    Syncope and collapse 02/01/2019   Tobacco use 02/01/2019   Vertigo     Past Surgical History:  Procedure Laterality Date   COLONOSCOPY  01/22/2016   Colon Polyp(s). Internal hemorrhoids   LAPAROSCOPIC HYSTERECTOMY     SINOSCOPY      Current Medications: Current Meds  Medication Sig   albuterol (VENTOLIN HFA) 108 (90 Base) MCG/ACT inhaler Inhale 1-2 puffs into the lungs every 6 (six) hours as needed for wheezing or shortness of breath.   dicyclomine (BENTYL) 10 MG capsule Take 10 mg by mouth 4 (four) times daily -  before meals and at bedtime.   ipratropium-albuterol (DUONEB) 0.5-2.5 (3) MG/3ML SOLN Take 3 mLs by nebulization every 6 (six) hours as needed.   loratadine (CLARITIN) 10 MG tablet Take 10 mg by mouth daily.   metoprolol tartrate (LOPRESSOR) 50 MG tablet Take 1 tablet (50 mg total) by mouth once for 1 dose.   rosuvastatin (CRESTOR) 5 MG tablet Take 1 tablet (5 mg total) by mouth daily.   Vitamin D, Ergocalciferol, (DRISDOL) 1.25 MG (50000 UNIT) CAPS capsule Take 1 capsule (50,000 Units total) by mouth every 7 (seven) days.     Allergies:    Anoro ellipta [umeclidinium-vilanterol] and Penicillins   Social History   Socioeconomic History   Marital status: Single    Spouse name: Not on file   Number of children: Not on file   Years of education: Not on file   Highest education level: Not on file  Occupational History   Not on file  Tobacco Use   Smoking status: Former    Packs/day: 1.00    Types: Cigarettes    Quit date: 07/15/2021    Years since quitting: 0.4   Smokeless tobacco: Never  Vaping Use   Vaping Use: Never used  Substance and Sexual Activity   Alcohol use: Yes    Comment: Rare, special occasion   Drug use: Not Currently   Sexual activity: Not on file  Other Topics Concern   Not on file  Social History Narrative   Not on file   Social Determinants of Health   Financial Resource Strain: Not on file  Food Insecurity: Not on file  Transportation Needs: Not on file  Physical Activity: Not on file  Stress: Not on file  Social Connections: Not on file     Family History: The patient's family history includes Allergic rhinitis in her mother and son; Arthritis in her mother; Colon cancer in her father; Heart attack in her maternal grandfather and maternal  uncle; High Cholesterol in her father; Irritable bowel syndrome in her maternal grandmother, mother, and niece; Stroke in her maternal aunt and maternal grandmother.  ROS:   Review of Systems  Constitution: Negative for decreased appetite, fever and weight gain.  HENT: Negative for congestion, ear discharge, hoarse voice and sore throat.   Eyes: Negative for discharge, redness, vision loss in right eye and visual halos.  Cardiovascular: Negative for chest pain, dyspnea on exertion, leg swelling, orthopnea and palpitations.  Respiratory: Negative for cough, hemoptysis, shortness of breath and snoring.   Endocrine: Negative for heat intolerance and polyphagia.  Hematologic/Lymphatic: Negative for bleeding problem. Does not bruise/bleed easily.  Skin:  Negative for flushing, nail changes, rash and suspicious lesions.  Musculoskeletal: Negative for arthritis, joint pain, muscle cramps, myalgias, neck pain and stiffness.  Gastrointestinal: Negative for abdominal pain, bowel incontinence, diarrhea and excessive appetite.  Genitourinary: Negative for decreased libido, genital sores and incomplete emptying.  Neurological: Negative for brief paralysis, focal weakness, headaches and loss of balance.  Psychiatric/Behavioral: Negative for altered mental status, depression and suicidal ideas.  Allergic/Immunologic: Negative for HIV exposure and persistent infections.    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 Right dominance.   Coronary Calcium Score:   Left main: 0   Left anterior descending artery: 0   Left circumflex artery: 11   Right coronary artery: 0   Ramus intermedius artery: 0   Total: 11   Percentile: 82nd for age, sex, and race matched control.   RCA is a large dominant artery that gives rise to PDA and PLA. Arises from the left cusp. With a malignant course. No intramural course. There is an acute angle take off. There is a slit like orifice.   Left main is a large artery that gives rise to LAD and LCX arteries. There is no significant plaque.   LAD is gives rise to two diagonal vessels. There is no significant plaque.   LCX is a non-dominant artery that gives rise to two OM vessels. Minimal calcified plaque mLCX.   Other findings:   Aorta: Normal size.  No calcifications.  No dissection.   Systemic Veins: Normal drainage   Aortic Valve:  Tri-leaflet.  No calcifications.   Mitral valve: No calcifications.   Normal pulmonary vein drainage into the left atrium.   Normal left atrial appendage without a thrombus (incompletely imaged- chicken wing  morphology with superior portion excluded from field of view)   Interatrial septum notable for small PFO   Left Ventricle: Normal size   Left Atrium: Normal size   Right Ventricle: Normal size   Right Atrium: Normal size   Pericardium: Normal thickness   Extra-cardiac findings: See attached radiology report for non-cardiac structures.   Artifact: Patient motion slab artifact.   IMPRESSION: 1. Coronary calcium score of 11. This was 82nd percentile for age, sex, and race matched control.   2. Normal coronary origin with Right dominance.   3. 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 Will send FFR, as ischemia may change management.   4. CAD-RADS 1 otherwise.   Recent Labs: 11/22/2021: BUN 17; Creatinine, Ser 0.70; Hemoglobin 12.4; Magnesium 2.2; Platelets 271; Potassium 4.2; Sodium 139; TSH 1.490  Recent Lipid Panel    Component Value Date/Time   CHOL 244 (H) 11/22/2021 0850   TRIG 143 11/22/2021  0850   HDL 65 11/22/2021 0850   CHOLHDL 3.8 11/22/2021 0850   LDLCALC 154 (H) 11/22/2021 0850    Physical Exam:    VS:  BP 110/68   Pulse 82   Ht '5\' 3"'$  (1.6 m)   Wt 152 lb 6.4 oz (69.1 kg)   SpO2 99%   BMI 27.00 kg/m     Wt Readings from Last 3 Encounters:  12/23/21 152 lb 6.4 oz (69.1 kg)  11/22/21 158 lb 3.2 oz (71.8 kg)  10/14/21 151 lb 3.2 oz (68.6 kg)     GEN: Well nourished, well developed in no acute distress HEENT: Normal NECK: No JVD; No carotid bruits LYMPHATICS: No lymphadenopathy CARDIAC: S1S2 noted,RRR, no murmurs, rubs, gallops RESPIRATORY:  Clear to auscultation without rales, wheezing or rhonchi  ABDOMEN: Soft, non-tender, non-distended, +bowel sounds, no guarding. EXTREMITIES: No edema, No cyanosis, no clubbing MUSCULOSKELETAL:  No deformity  SKIN: Warm and dry NEUROLOGIC:  Alert and oriented x 3, non-focal PSYCHIATRIC:  Normal affect, good insight  ASSESSMENT:    1. Minimal CAD   2. Anomalous coronary artery origin    3. Hyperlipidemia, unspecified hyperlipidemia type    PLAN:     No angina symptoms. Continue crestor - LDL goal is <70   The patient is in agreement with the above plan. The patient left the office in stable condition.  The patient will follow up in   Medication Adjustments/Labs and Tests Ordered: Current medicines are reviewed at length with the patient today.  Concerns regarding medicines are outlined above.  No orders of the defined types were placed in this encounter.  No orders of the defined types were placed in this encounter.   Patient Instructions  Medication Instructions:  Your physician recommends that you continue on your current medications as directed. Please refer to the Current Medication list given to you today.  *If you need a refill on your cardiac medications before your next appointment, please call your pharmacy*   Lab Work: NONE If you have labs (blood work) drawn today and your tests are completely normal, you will receive your results only by: [place] (if you have MyChart) OR A paper copy in the mail If you have any lab test that is abnormal or we need to change your treatment, we will call you to review the results.   Testing/Procedures: NONE   Follow-Up: At [facility], you and your health needs are our priority.  As part of our continuing mission to provide you with exceptional heart care, we have created designated Provider Care Teams.  These Care Teams include your primary Cardiologist (physician) and Advanced Practice Providers (APPs -  Physician Assistants and Nurse Practitioners) who all work together to provide you with the care you need, when you need it.  We recommend signing up for the patient portal called "MyChart".  Sign up information is provided on this After Visit Summary.  MyChart is used to connect with patients for Virtual Visits (Telemedicine).  Patients are able to view lab/test results, encounter notes,  upcoming appointments, etc.  Non-urgent messages can be sent to your provider as well.   To learn more about what you can do with MyChart, go to [email or web address].    Your next appointment:   1 year(s)  The format for your next appointment:   In Person  Provider:   Berniece Salines, DO     Adopting a Healthy Lifestyle.  Know what a healthy weight is for you (roughly BMI <  25) and aim to maintain this   Aim for 7+ servings of fruits and vegetables daily   65-80+ fluid ounces of water or unsweet tea for healthy kidneys   Limit to max 1 drink of alcohol per day; avoid smoking/tobacco   Limit animal fats in diet for cholesterol and heart health - choose grass fed whenever available   Avoid highly processed foods, and foods high in saturated/trans fats   Aim for low stress - take time to unwind and care for your mental health   Aim for 150 min of moderate intensity exercise weekly for heart health, and weights twice weekly for bone health   Aim for 7-9 hours of sleep daily   When it comes to diets, agreement about the perfect plan isnt easy to find, even among the experts. Experts at the [employer] developed an idea known as the Healthy Eating Plate. Just imagine a plate divided into logical, healthy portions.   The emphasis is on diet quality:   Load up on vegetables and fruits - one-half of your plate: Aim for color and variety, and remember that potatoes dont count.   Go for whole grains - one-quarter of your plate: Whole wheat, barley, wheat berries, quinoa, oats, brown rice, and foods made with them. If you want pasta, go with whole wheat pasta.   Protein power - one-quarter of your plate: Fish, chicken, beans, and nuts are all healthy, versatile protein sources. Limit red meat.   The diet, however, does go beyond the plate, offering a few other suggestions.   Use healthy plant oils, such as olive, canola, soy, corn, sunflower and peanut. Check the  labels, and avoid partially hydrogenated oil, which have unhealthy trans fats.   If youre thirsty, drink water. Coffee and tea are good in moderation, but skip sugary drinks and limit milk and dairy products to one or two daily servings.   The type of carbohydrate in the diet is more important than the amount. Some sources of carbohydrates, such as vegetables, fruits, whole grains, and beans-are healthier than others.   Finally, stay active  Signed, Berniece Salines, DO  12/25/2021 2:11 PM    [facility]

## 2021-12-23 NOTE — Patient Instructions (Signed)
Medication Instructions:  Your physician recommends that you continue on your current medications as directed. Please refer to the Current Medication list given to you today.  *If you need a refill on your cardiac medications before your next appointment, please call your pharmacy*   Lab Work: NONE If you have labs (blood work) drawn today and your tests are completely normal, you will receive your results only by: MyChart Message (if you have MyChart) OR A paper copy in the mail If you have any lab test that is abnormal or we need to change your treatment, we will call you to review the results.   Testing/Procedures: NONE   Follow-Up: At [facility], you and your health needs are our priority.  As part of our continuing mission to provide you with exceptional heart care, we have created designated Provider Care Teams.  These Care Teams include your primary Cardiologist (physician) and Advanced Practice Providers (APPs -  Physician Assistants and Nurse Practitioners) who all work together to provide you with the care you need, when you need it.  We recommend signing up for the patient portal called "MyChart".  Sign up information is provided on this After Visit Summary.  MyChart is used to connect with patients for Virtual Visits (Telemedicine).  Patients are able to view lab/test results, encounter notes, upcoming appointments, etc.  Non-urgent messages can be sent to your provider as well.   To learn more about what you can do with MyChart, go to [email or web address].    Your next appointment:   1 year(s)  The format for your next appointment:   In Person  Provider:   Kardie Tobb, DO   

## 2021-12-27 ENCOUNTER — Encounter: Payer: Self-pay | Admitting: Cardiology

## 2021-12-28 ENCOUNTER — Telehealth: Payer: Self-pay | Admitting: Pulmonary Disease

## 2021-12-28 ENCOUNTER — Encounter: Payer: Self-pay | Admitting: Pulmonary Disease

## 2021-12-28 NOTE — Telephone Encounter (Signed)
RF.VOHKGOV, [name] reached out through [employer] today. She tested positive for COVID on Sunday and they started her on Paxlovid. She reports her O2 dropped to 85% but returned to low 90's quickly after resting. She has been using her duoneb treatments once per day but I did advise her she can do it every 6hrs PRN as directed until she begins felling better and to make a f/u with [place] once she finishes her quarantine period/paxlovid .   She denies fevers, doesn't use O2, and feels like she only has a sinus infection. Just wanted to send you this [facility]

## 2021-12-28 NOTE — Telephone Encounter (Signed)
Called and spoke with pt letting her know that she needed to go to the ED to be evaluated due to her not feeling well and having low O2 sats after testing positive for covid. Pt stated she was going to try to do another neb treatment to see if that would help get O2 sats up. Stated to her after doing the neb treatment if sats are still low that she needed to go to the ED and she verbalized understanding. Nothing further needed.

## 2022-01-31 ENCOUNTER — Ambulatory Visit: Payer: No Typology Code available for payment source | Admitting: Family Medicine

## 2022-02-07 ENCOUNTER — Encounter: Payer: Self-pay | Admitting: Pulmonary Disease

## 2022-02-07 NOTE — Telephone Encounter (Signed)
Just to clarify, symptoms are DOE, fatigue and possible fevers? I saw she had COVID a little over a month ago. Would recommend she swab for the flu. Use duonebs 4 times a day until symptoms improve. Can use mucinex if she has congestion/cough. If no improvement, should contact us for further evaluation. If she has drops in her oxygen <88-90%, needs to go to the ED. Thanks.

## 2022-02-07 NOTE — Telephone Encounter (Signed)
Mychart message conversation sent: Holly Deleon  [facility] (supporting Juanito Doom, MD)7 minutes ago (11:00 AM)   I normally just do 1 in the morning, yesterday I also did one in the afternoon felt like it did no good. I felt like I had a fever yesterday, nothing else just very lazy.   Oxygen level now is around 98 I just hit the inhaler 1 time.  I have never done over 2 in a day typically I do 1 and works fine. No I am not on oxygen    You  Holly Allen43 minutes ago (10:24 AM)    Mrs. Holly Deleon,   Are you experiencing any other symptoms? What are your oxygen saturations ranging at currently and are you on any oxygen? Any fever?  How many breathing treatments have you had to so recently in a single day at most?    [place] [facility] (supporting Juanito Doom, MD)58 minutes ago (10:09 AM)   This started yesterday I fell very tired so I did another treatment and this morning I did one and I fell very tired & my breathing is a little different should i do my inhaler? Thanks     Routing to [employer] for review. Please advise what you recommend. No openings today at office and not any the rest of the week with any nurse practitioner.

## 2022-02-17 ENCOUNTER — Encounter: Payer: Self-pay | Admitting: Cardiology

## 2022-02-17 ENCOUNTER — Ambulatory Visit: Payer: Commercial Managed Care - HMO | Attending: Cardiology | Admitting: Cardiology

## 2022-02-17 VITALS — BP 110/84 | HR 74 | Ht 63.0 in | Wt 143.6 lb

## 2022-02-17 DIAGNOSIS — E559 Vitamin D deficiency, unspecified: Secondary | ICD-10-CM

## 2022-02-17 DIAGNOSIS — E785 Hyperlipidemia, unspecified: Secondary | ICD-10-CM | POA: Diagnosis not present

## 2022-02-17 DIAGNOSIS — I2510 Atherosclerotic heart disease of native coronary artery without angina pectoris: Secondary | ICD-10-CM | POA: Diagnosis not present

## 2022-02-17 NOTE — Patient Instructions (Signed)
Medication Instructions:  Your physician recommends that you continue on your current medications as directed. Please refer to the Current Medication list given to you today.  *If you need a refill on your cardiac medications before your next appointment, please call your pharmacy*   Lab Work: Your physician recommends that you have labs drawn today: Lipids, Vit D If you have labs (blood work) drawn today and your tests are completely normal, you will receive your results only by: [place] (if you have MyChart) OR A paper copy in the mail If you have any lab test that is abnormal or we need to change your treatment, we will call you to review the results.   Testing/Procedures: None   Follow-Up: At [facility], you and your health needs are our priority.  As part of our continuing mission to provide you with exceptional heart care, we have created designated Provider Care Teams.  These Care Teams include your primary Cardiologist (physician) and Advanced Practice Providers (APPs -  Physician Assistants and Nurse Practitioners) who all work together to provide you with the care you need, when you need it.  Your next appointment:   1 year(s)  Provider:   Berniece Salines, DO

## 2022-02-17 NOTE — Progress Notes (Signed)
Cardiology Office Note:    Date:  02/18/2022   ID:  Holly Deleon, DOB 03-Feb-1965, MRN [id number]  PCP:  Angelina Sheriff, MD  Cardiologist:  Berniece Salines, DO  Electrophysiologist:  None   Referring MD: Angelina Sheriff, MD   " I am ok"  History of Present Illness:    Holly Deleon is a 57 y.o. 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  Since her last visit she tells me she overall has been doing well.  She does get some muscle aches and pains which she contributes to being on the Crestor.   Past Medical History:  Diagnosis Date   Abnormal cervical Papanicolaou smear 02/08/2018   Allergic rhinitis    Asthma    Chronic sinusitis    Herniated cervical disc 03/10/2020   Hyperthyroidism 04/01/2020   IBS (irritable bowel syndrome)    Irritable bowel syndrome 02/08/2018   Palpitations 02/01/2019   Sleep apnea    Syncope and collapse 02/01/2019   Tobacco use 02/01/2019   Vertigo     Past Surgical History:  Procedure Laterality Date   COLONOSCOPY  01/22/2016   Colon Polyp(s). Internal hemorrhoids   LAPAROSCOPIC HYSTERECTOMY     SINOSCOPY      Current Medications: Current Meds  Medication Sig   albuterol (VENTOLIN HFA) 108 (90 Base) MCG/ACT inhaler Inhale 1-2 puffs into the lungs every 6 (six) hours as needed for wheezing or shortness of breath.   dicyclomine (BENTYL) 10 MG capsule Take 10 mg by mouth 4 (four) times daily -  before meals and at bedtime.   ipratropium-albuterol (DUONEB) 0.5-2.5 (3) MG/3ML SOLN Take 3 mLs by nebulization every 6 (six) hours as needed.   loratadine (CLARITIN) 10 MG tablet Take 10 mg by mouth daily.   rosuvastatin (CRESTOR) 5 MG tablet Take 1 tablet (5 mg total) by mouth daily.   Vitamin D, Ergocalciferol, (DRISDOL) 1.25 MG (50000 UNIT) CAPS capsule Take 1 capsule (50,000 Units total) by mouth every 7 (seven) days.    [DISCONTINUED] metoprolol tartrate (LOPRESSOR) 50 MG tablet Take 1 tablet (50 mg total) by mouth once for 1 dose.     Allergies:   Anoro ellipta [umeclidinium-vilanterol] and Penicillins   Social History   Socioeconomic History   Marital status: Single    Spouse name: Not on file   Number of children: Not on file   Years of education: Not on file   Highest education level: Not on file  Occupational History   Not on file  Tobacco Use   Smoking status: Former    Packs/day: 1.00    Types: Cigarettes    Quit date: 07/15/2021    Years since quitting: 0.5   Smokeless tobacco: Never  Vaping Use   Vaping Use: Never used  Substance and Sexual Activity   Alcohol use: Yes    Comment: Rare, special occasion   Drug use: Not Currently   Sexual activity: Not on file  Other Topics Concern   Not on file  Social History Narrative   Not on file   Social Determinants of Health   Financial Resource Strain: Not on file  Food Insecurity: Not on file  Transportation Needs: Not on file  Physical Activity: Not on file  Stress: Not on file  Social Connections: Not on file     Family History: The patient's family history includes Allergic rhinitis  in her mother and son; Arthritis in her mother; Colon cancer in her father; Heart attack in her maternal grandfather and maternal uncle; High Cholesterol in her father; Irritable bowel syndrome in her maternal grandmother, mother, and niece; Stroke in her maternal aunt and maternal grandmother.  ROS:   Review of Systems  Constitution: Negative for decreased appetite, fever and weight gain.  HENT: Negative for congestion, ear discharge, hoarse voice and sore throat.   Eyes: Negative for discharge, redness, vision loss in right eye and visual halos.  Cardiovascular: Negative for chest pain, dyspnea on exertion, leg swelling, orthopnea and palpitations.  Respiratory: Negative for cough, hemoptysis, shortness of breath and snoring.   Endocrine:  Negative for heat intolerance and polyphagia.  Hematologic/Lymphatic: Negative for bleeding problem. Does not bruise/bleed easily.  Skin: Negative for flushing, nail changes, rash and suspicious lesions.  Musculoskeletal: Negative for arthritis, joint pain, muscle cramps, myalgias, neck pain and stiffness.  Gastrointestinal: Negative for abdominal pain, bowel incontinence, diarrhea and excessive appetite.  Genitourinary: Negative for decreased libido, genital sores and incomplete emptying.  Neurological: Negative for brief paralysis, focal weakness, headaches and loss of balance.  Psychiatric/Behavioral: Negative for altered mental status, depression and suicidal ideas.  Allergic/Immunologic: Negative for HIV exposure and persistent infections.    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 Right dominance.   Coronary Calcium Score:   Left main: 0   Left anterior descending artery: 0   Left circumflex artery: 11   Right coronary artery: 0   Ramus intermedius artery: 0   Total: 11   Percentile: 82nd for age, sex, and race matched control.   RCA is a large dominant artery that gives rise to PDA and PLA. Arises from the left cusp. With a malignant course. No intramural course. There is an acute angle take off. There is a slit like orifice.   Left main is a large artery that gives rise to LAD and LCX arteries. There is no significant plaque.   LAD is gives rise to two diagonal vessels. There is no significant plaque.   LCX is a non-dominant artery that gives rise to two OM vessels. Minimal calcified plaque mLCX.   Other findings:   Aorta: Normal size.  No calcifications.  No dissection.   Systemic Veins: Normal drainage   Aortic Valve:  Tri-leaflet.  No calcifications.   Mitral valve: No calcifications.    Normal pulmonary vein drainage into the left atrium.   Normal left atrial appendage without a thrombus (incompletely imaged- chicken wing morphology with superior portion excluded from field of view)   Interatrial septum notable for small PFO   Left Ventricle: Normal size   Left Atrium: Normal size   Right Ventricle: Normal size   Right Atrium: Normal size   Pericardium: Normal thickness   Extra-cardiac findings: See attached radiology report for non-cardiac structures.   Artifact: Patient motion slab artifact.   IMPRESSION: 1. Coronary calcium score of 11. This was 82nd percentile for age, sex, and race matched control.   2. Normal coronary origin with Right dominance.   3. 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 Will send FFR, as ischemia may change management.   4. CAD-RADS 1 otherwise.   Recent Labs: 11/22/2021: BUN 17; Creatinine, Ser 0.70; Hemoglobin 12.4; Magnesium 2.2; Platelets 271; Potassium 4.2; Sodium 139; TSH 1.490  Recent Lipid Panel    Component Value Date/Time   CHOL 179 02/17/2022 1059   TRIG 70 02/17/2022 1059   HDL 80 02/17/2022 1059   CHOLHDL 2.2 02/17/2022 1059   LDLCALC 86 02/17/2022 1059    Physical Exam:    VS:  BP 110/84   Pulse 74   Ht 5' 3"$  (1.6 m)   Wt 65.1 kg   SpO2 99%   BMI 25.44 kg/m     Wt Readings from Last 3 Encounters:  02/17/22 65.1 kg  12/23/21 69.1 kg  11/22/21 71.8 kg     GEN: Well nourished, well developed in no acute distress HEENT: Normal NECK: No JVD; No carotid bruits LYMPHATICS: No lymphadenopathy CARDIAC: S1S2 noted,RRR, no murmurs, rubs, gallops RESPIRATORY:  Clear to auscultation without rales, wheezing or rhonchi  ABDOMEN: Soft, non-tender, non-distended, +bowel sounds, no guarding. EXTREMITIES: No edema, No cyanosis, no clubbing MUSCULOSKELETAL:  No deformity  SKIN: Warm and dry NEUROLOGIC:  Alert and oriented x 3, non-focal PSYCHIATRIC:  Normal affect, good  insight  ASSESSMENT:    1. Vitamin D deficiency   2. Hyperlipidemia, unspecified hyperlipidemia type   3. Mild CAD    PLAN:     We discussed options today for lipid-lowering agents.  For now she prefers to stay on the Crestor we will get blood work if her lipid profile does not show any improvement we will transition the patient to PCSK9 inhibitors.    Will repeat the vitamin D level in the setting of her vitamin D deficiency.  The patient is in agreement with the above plan. The patient left the office in stable condition.  The patient will follow up in 1 year or sooner if needed.   Medication Adjustments/Labs and Tests Ordered: Current medicines are reviewed at length with the patient today.  Concerns regarding medicines are outlined above.  Orders Placed This Encounter  Procedures   VITAMIN D 25 Hydroxy (Vit-D Deficiency, Fractures)   Lipid panel   No orders of the defined types were placed in this encounter.   Patient Instructions  Medication Instructions:  Your physician recommends that you continue on your current medications as directed. Please refer to the Current Medication list given to you today.  *If you need a refill on your cardiac medications before your next appointment, please call your pharmacy*   Lab Work: Your physician recommends that you have labs drawn today: Lipids, Vit D If you have labs (blood work) drawn today and your tests are completely normal, you will receive your results only by: [place] (if you have MyChart) OR A paper copy in the mail If you have any lab test that is abnormal or we need to change your treatment, we will call you to review the results.   Testing/Procedures: None   Follow-Up: At [facility], you and your health needs are our priority.  As part of our continuing mission to provide you with exceptional heart care, we have created designated Provider Care Teams.  These Care Teams include your primary  Cardiologist (physician) and Advanced Practice Providers (APPs -  Physician Assistants and Nurse Practitioners) who all work together to provide you with the care you need, when you need it.  Your next appointment:   1 year(s)  Provider:   Berniece Salines, DO      Adopting a Healthy Lifestyle.  Know what a healthy weight is for you (roughly BMI <25) and aim to maintain this   Aim for 7+ servings  of fruits and vegetables daily   65-80+ fluid ounces of water or unsweet tea for healthy kidneys   Limit to max 1 drink of alcohol per day; avoid smoking/tobacco   Limit animal fats in diet for cholesterol and heart health - choose grass fed whenever available   Avoid highly processed foods, and foods high in saturated/trans fats   Aim for low stress - take time to unwind and care for your mental health   Aim for 150 min of moderate intensity exercise weekly for heart health, and weights twice weekly for bone health   Aim for 7-9 hours of sleep daily   When it comes to diets, agreement about the perfect plan isnt easy to find, even among the experts. Experts at the [employer] developed an idea known as the Healthy Eating Plate. Just imagine a plate divided into logical, healthy portions.   The emphasis is on diet quality:   Load up on vegetables and fruits - one-half of your plate: Aim for color and variety, and remember that potatoes dont count.   Go for whole grains - one-quarter of your plate: Whole wheat, barley, wheat berries, quinoa, oats, brown rice, and foods made with them. If you want pasta, go with whole wheat pasta.   Protein power - one-quarter of your plate: Fish, chicken, beans, and nuts are all healthy, versatile protein sources. Limit red meat.   The diet, however, does go beyond the plate, offering a few other suggestions.   Use healthy plant oils, such as olive, canola, soy, corn, sunflower and peanut. Check the labels, and avoid partially  hydrogenated oil, which have unhealthy trans fats.   If youre thirsty, drink water. Coffee and tea are good in moderation, but skip sugary drinks and limit milk and dairy products to one or two daily servings.   The type of carbohydrate in the diet is more important than the amount. Some sources of carbohydrates, such as vegetables, fruits, whole grains, and beans-are healthier than others.   Finally, stay active  Signed, Berniece Salines, DO  02/18/2022 8:07 AM    [place]

## 2022-02-18 LAB — LIPID PANEL
Chol/HDL Ratio: 2.2 ratio (ref 0.0–4.4)
Cholesterol, Total: 179 mg/dL (ref 100–199)
HDL: 80 mg/dL (ref >39)
LDL Chol Calc (NIH): 86 mg/dL (ref 0–99)
Triglycerides: 70 mg/dL (ref 0–149)
VLDL Cholesterol Cal: 13 mg/dL (ref 5–40)

## 2022-02-18 LAB — VITAMIN D 25 HYDROXY (VIT D DEFICIENCY, FRACTURES): Vit D, 25-Hydroxy: 71 ng/mL (ref 30.0–100.0)

## 2022-03-09 ENCOUNTER — Encounter: Payer: Self-pay | Admitting: Cardiology

## 2022-03-09 ENCOUNTER — Other Ambulatory Visit: Payer: Self-pay

## 2022-03-09 DIAGNOSIS — E785 Hyperlipidemia, unspecified: Secondary | ICD-10-CM

## 2022-03-09 NOTE — Telephone Encounter (Signed)
Multiple encounters open. Message copy and pasted. Closing encounter.

## 2022-03-09 NOTE — Progress Notes (Signed)
Referral placed.

## 2022-03-17 ENCOUNTER — Other Ambulatory Visit: Payer: Self-pay | Admitting: Obstetrics and Gynecology

## 2022-03-17 DIAGNOSIS — R928 Other abnormal and inconclusive findings on diagnostic imaging of breast: Secondary | ICD-10-CM

## 2022-03-28 ENCOUNTER — Ambulatory Visit
Admission: RE | Admit: 2022-03-28 | Discharge: 2022-03-28 | Disposition: A | Discharge status: Home / Self Care | Payer: Commercial Managed Care - HMO | Source: Ambulatory Visit | Attending: Obstetrics and Gynecology | Admitting: Obstetrics and Gynecology

## 2022-03-28 ENCOUNTER — Ambulatory Visit: Payer: No Typology Code available for payment source

## 2022-03-28 DIAGNOSIS — R928 Other abnormal and inconclusive findings on diagnostic imaging of breast: Secondary | ICD-10-CM

## 2022-04-14 ENCOUNTER — Encounter: Payer: Self-pay | Admitting: Pharmacist Clinician (PhC)/ Clinical Pharmacy Specialist

## 2022-04-14 ENCOUNTER — Ambulatory Visit
Payer: Commercial Managed Care - HMO | Attending: Cardiology | Admitting: Pharmacist Clinician (PhC)/ Clinical Pharmacy Specialist

## 2022-04-14 DIAGNOSIS — E785 Hyperlipidemia, unspecified: Secondary | ICD-10-CM | POA: Diagnosis not present

## 2022-04-14 MED ORDER — ATORVASTATIN CALCIUM 10 MG PO TABS: 10.0000 mg | ORAL_TABLET | Freq: Every day | ORAL | 1 refills | Status: DC

## 2022-04-14 NOTE — Patient Instructions (Signed)
Your Results:             Your most recent labs Goal  Total Cholesterol 179 < 200  Triglycerides 70 < 150  HDL (happy/good cholesterol) 80 > 40  LDL (lousy/bad cholesterol 86 < 70   Medication changes:  Start atorvastatin 10 mg three times each week for the first 4 weeks, then increase to 1 tablet daily as tolerated.  If you have problems with this, please reach out with a MyChart message and we can try to see if your insurance will cover the Repatha injections  Lab orders:  We want to repeat labs after 2-3 months.  We will send you a lab order to remind you once we get closer to that time.      Thank you for choosing [facility]

## 2022-04-14 NOTE — Assessment & Plan Note (Signed)
Assessment: Patient with ASCVD not at LDL goal of < 70 Most recent LDL 86 on rosuvastatin 5 mg daily Not able to tolerate statins secondary to myalgias - stopped this week Reviewed options for lowering LDL cholesterol, including ezetimibe, PCSK-9 inhibitors.  Discussed mechanisms of action, dosing, side effects, potential decreases in LDL cholesterol and costs.  Also reviewed potential options for patient assistance.  Plan: Patient has not been challenged with second statin Patient agreeable to starting atorvastatin 10 mg, will start with three days per week and increase to daily after 1 month if tolerating. Repeat labs after:  3 months Lipid Liver function She was asked to reach out to office if she cannot tolerate the atorvastatin and we will see if can get PCSK9 coverage

## 2022-04-14 NOTE — Progress Notes (Signed)
Office Visit    Patient Name: Holly Deleon Date of Encounter: 04/14/2022  Primary Care Provider:  Noni Saupe, MD Primary Cardiologist:  Thomasene Ripple, DO  Chief Complaint    Hyperlipidemia   Significant Past Medical History   CAD CAC 82nd percentile, all in LCX  Vit D deficiency Improved with supplementation  hyperthyroid TSH WNL  asthma PRN albuterol     Allergies  Allergen Reactions   Anoro Ellipta [Umeclidinium-Vilanterol] Other (See Comments)    Caused mouth ulcers   Penicillins Other (See Comments)    "My muscles relax" Unknown Did it involve swelling of the face/tongue/throat, SOB, or low BP? Unknown Did it involve sudden or severe rash/hives, skin peeling, or any reaction on the inside of your mouth or nose? Unknown Did you need to seek medical attention at a hospital or doctor's office? Yes When did it last happen?       If all above answers are "NO", may proceed with cephalosporin use.      History of Present Illness    Holly Deleon is a 57 y.o. female patient of Dr Servando Salina, in the office today to discuss options for cholesterol management.  She has been on rosuvastatin 5 mg in the past, which brought her LDL down from 154 to 86.  This was discontinued recently however, because of myalgias.  She has not tried any other statin drugs.    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  LDL Cholesterol goal:  LDL < 70  Current Medications:   none  Previously tried:  rosuvastatin 5 mg   Family Hx:   father now 64 (high cholesterol, colon cancer -remission); mother now 18 (arthritis); brother healthy; son now 64 (IBS)  Social Hx: Tobacco: quit July 15, 2021 Alcohol:  on weekends, red wine, gluten free beers    Diet:  more home cooked meals, to avoid IBS flares; protein is fish, chicken, [place], occasional red meats; green vegetables, cauliflower rice - mostly fresh; snacks - pistacios, popcorn, carrots/hummis    Exercise: none   Accessory Clinical Findings    Lab Results  Component Value Date   CHOL 179 02/17/2022   HDL 80 02/17/2022   LDLCALC 86 02/17/2022   TRIG 70 02/17/2022   CHOLHDL 2.2 02/17/2022    No results found for: "ALT", "AST", "GGT", "ALKPHOS", "BILITOT" Lab Results  Component Value Date   CREATININE 0.70 11/22/2021   BUN 17 11/22/2021   NA 139 11/22/2021   K 4.2 11/22/2021   CL 102 11/22/2021   CO2 23 11/22/2021   Lab Results  Component Value Date   HGBA1C 5.8 (H) 11/22/2021    Home Medications    Current Outpatient Medications  Medication Sig Dispense Refill   albuterol (VENTOLIN HFA) 108 (90 Base) MCG/ACT inhaler Inhale 1-2 puffs into the lungs every 6 (six) hours as needed for wheezing or shortness of breath.     atorvastatin (LIPITOR) 10 MG tablet Take 1 tablet (10 mg total) by mouth daily. 90 tablet 1   dicyclomine (BENTYL) 10 MG capsule Take 10 mg by mouth 4 (four) times daily as needed.     ipratropium-albuterol (DUONEB) 0.5-2.5 (3) MG/3ML SOLN Take 3 mLs by nebulization every 6 (six) hours as needed. 360 mL 5   loratadine (CLARITIN) 10 MG tablet Take 10 mg by mouth daily.     No current facility-administered medications for this visit.     Assessment & Plan    Hyperlipidemia Assessment: Patient with ASCVD not at LDL goal  of < 70 Most recent LDL 86 on rosuvastatin 5 mg daily Not able to tolerate statins secondary to myalgias - stopped this week Reviewed options for lowering LDL cholesterol, including ezetimibe, PCSK-9 inhibitors.  Discussed mechanisms of action, dosing, side effects, potential decreases in LDL cholesterol and costs.  Also reviewed potential options for patient assistance.  Plan: Patient has not been challenged with second statin Patient agreeable to starting atorvastatin 10 mg, will start with three days per week and increase to daily after 1 month if tolerating. Repeat labs after:  3 months Lipid Liver function She was asked to reach out to office if she cannot tolerate the  atorvastatin and we will see if can get PCSK9 coverage   Phillips Hay, PharmD CPP [facility] [address]  [place], [place] [address] [phone]  04/14/2022, 1:22 PM

## 2022-06-17 ENCOUNTER — Other Ambulatory Visit: Payer: Self-pay | Admitting: *Deleted

## 2022-06-17 DIAGNOSIS — M47816 Spondylosis without myelopathy or radiculopathy, lumbar region: Secondary | ICD-10-CM

## 2022-06-17 DIAGNOSIS — M4316 Spondylolisthesis, lumbar region: Secondary | ICD-10-CM

## 2022-06-28 ENCOUNTER — Encounter: Payer: Self-pay | Admitting: Gastroenterology

## 2022-07-15 ENCOUNTER — Other Ambulatory Visit: Payer: Commercial Managed Care - HMO

## 2022-07-24 ENCOUNTER — Other Ambulatory Visit: Payer: Self-pay | Admitting: Acute Care

## 2022-07-24 DIAGNOSIS — Z87891 Personal history of nicotine dependence: Secondary | ICD-10-CM

## 2022-07-24 DIAGNOSIS — Z122 Encounter for screening for malignant neoplasm of respiratory organs: Secondary | ICD-10-CM

## 2022-09-12 ENCOUNTER — Encounter: Payer: Self-pay | Admitting: Cardiology

## 2022-09-12 ENCOUNTER — Other Ambulatory Visit: Payer: Self-pay | Admitting: Cardiology

## 2022-09-13 MED ORDER — ALBUTEROL SULFATE HFA 108 (90 BASE) MCG/ACT IN AERS: 1.0000 | INHALATION_SPRAY | Freq: Four times a day (QID) | RESPIRATORY_TRACT | 0 refills | Status: DC | PRN

## 2022-09-14 ENCOUNTER — Other Ambulatory Visit (HOSPITAL_COMMUNITY): Payer: Self-pay

## 2022-09-14 ENCOUNTER — Telehealth: Payer: Self-pay | Admitting: Pharmacy Technician

## 2022-09-14 ENCOUNTER — Telehealth: Payer: Self-pay | Admitting: Pharmacist Clinician (PhC)/ Clinical Pharmacy Specialist

## 2022-09-14 NOTE — Telephone Encounter (Signed)
Pharmacy Patient Advocate Encounter   Received notification from Pt Calls Messages that prior authorization for repatha is required/requested.   Insurance verification completed.   The patient is insured through [employer] .   Per test claim: PA required; PA submitted to [employer] via CoverMyMeds Key/confirmation #/EOC [id number] Status is pending

## 2022-09-14 NOTE — Telephone Encounter (Signed)
PA request has been Submitted-repatha. New Encounter created for follow up. For additional info see Pharmacy Prior Auth telephone encounter from 09/14/22.

## 2022-09-14 NOTE — Telephone Encounter (Signed)
Pharmacy Patient Advocate Encounter   Received notification from Pt Calls Messages that prior authorization for repatha is required/requested.   Insurance verification completed.   The patient is insured through [employer] .   Per test claim: PA required; PA started via CoverMyMeds. KEY [id number] . Waiting for clinical questions to populate.

## 2022-09-14 NOTE — Telephone Encounter (Signed)
Please do PA for Repatha 

## 2022-09-16 ENCOUNTER — Other Ambulatory Visit (HOSPITAL_COMMUNITY): Payer: Self-pay

## 2022-09-16 NOTE — Telephone Encounter (Signed)
Pharmacy Patient Advocate Encounter  Received notification from [employer] that Prior Authorization for repatha has been APPROVED from 09/16/22 to 09/16/23. Ran test claim, Copay is $24.99. This test claim was processed through [facility]- copay amounts may vary at other pharmacies due to pharmacy/plan contracts, or as the patient moves through the different stages of their insurance plan.   PA #/Case ID/Reference #: [id number]

## 2022-09-16 NOTE — Telephone Encounter (Signed)
Faxed over info 09/16/22

## 2022-09-19 ENCOUNTER — Encounter: Payer: Self-pay | Admitting: Pharmacist Clinician (PhC)/ Clinical Pharmacy Specialist

## 2022-09-19 MED ORDER — REPATHA SURECLICK 140 MG/ML ~~LOC~~ SOAJ: 140.0000 mg | SUBCUTANEOUS | 3 refills | Status: DC

## 2022-09-19 NOTE — Addendum Note (Signed)
Addended by: Rosalee Kaufman on: 09/19/2022 08:16 AM   Modules accepted: Orders

## 2022-09-27 ENCOUNTER — Other Ambulatory Visit: Payer: Self-pay | Admitting: Rehabilitation

## 2022-09-27 DIAGNOSIS — R222 Localized swelling, mass and lump, trunk: Secondary | ICD-10-CM

## 2022-09-27 DIAGNOSIS — M47816 Spondylosis without myelopathy or radiculopathy, lumbar region: Secondary | ICD-10-CM

## 2022-09-27 DIAGNOSIS — M4316 Spondylolisthesis, lumbar region: Secondary | ICD-10-CM

## 2022-10-03 ENCOUNTER — Ambulatory Visit (AMBULATORY_SURGERY_CENTER): Payer: No Typology Code available for payment source

## 2022-10-03 VITALS — Ht 63.0 in | Wt 140.0 lb

## 2022-10-03 DIAGNOSIS — Z800 Family history of malignant neoplasm of digestive organs: Secondary | ICD-10-CM

## 2022-10-03 DIAGNOSIS — Z860100 Personal history of colon polyps, unspecified: Secondary | ICD-10-CM

## 2022-10-03 MED ORDER — NA SULFATE-K SULFATE-MG SULF 17.5-3.13-1.6 GM/177ML PO SOLN: 1.0000 | Freq: Once | ORAL | 0 refills | Status: AC

## 2022-10-03 NOTE — Progress Notes (Signed)

## 2022-10-10 ENCOUNTER — Other Ambulatory Visit: Payer: Self-pay | Admitting: Acute Care

## 2022-10-12 ENCOUNTER — Other Ambulatory Visit: Payer: Commercial Managed Care - HMO

## 2022-10-17 ENCOUNTER — Encounter: Payer: Self-pay | Admitting: Acute Care

## 2022-10-18 ENCOUNTER — Ambulatory Visit
Admission: RE | Admit: 2022-10-18 | Discharge: 2022-10-18 | Disposition: A | Discharge status: Home / Self Care | Payer: Managed Care, Other (non HMO) | Source: Ambulatory Visit | Attending: Rehabilitation | Admitting: Rehabilitation

## 2022-10-18 DIAGNOSIS — M4316 Spondylolisthesis, lumbar region: Secondary | ICD-10-CM

## 2022-10-18 DIAGNOSIS — M47816 Spondylosis without myelopathy or radiculopathy, lumbar region: Secondary | ICD-10-CM

## 2022-10-18 DIAGNOSIS — R222 Localized swelling, mass and lump, trunk: Secondary | ICD-10-CM

## 2022-10-26 ENCOUNTER — Encounter: Payer: Commercial Managed Care - HMO | Admitting: Gastroenterology

## 2022-11-08 ENCOUNTER — Ambulatory Visit
Admission: RE | Admit: 2022-11-08 | Discharge: 2022-11-08 | Disposition: A | Discharge status: Home / Self Care | Payer: Managed Care, Other (non HMO) | Source: Ambulatory Visit | Attending: Acute Care | Admitting: Acute Care

## 2022-11-08 DIAGNOSIS — Z122 Encounter for screening for malignant neoplasm of respiratory organs: Secondary | ICD-10-CM

## 2022-11-08 DIAGNOSIS — Z87891 Personal history of nicotine dependence: Secondary | ICD-10-CM

## 2022-11-10 ENCOUNTER — Encounter: Payer: Self-pay | Admitting: Gastroenterology

## 2022-11-17 ENCOUNTER — Telehealth: Payer: Self-pay | Admitting: Gastroenterology

## 2022-11-17 NOTE — Telephone Encounter (Signed)
Please see previous message

## 2022-11-17 NOTE — Telephone Encounter (Signed)
Patient called stated that she had taken some Ibuprofen and Dicyclomine today and is unsure if that was okay because she is having a colonoscopy procedure tomorrow. Please advise.

## 2022-11-17 NOTE — Telephone Encounter (Signed)
Returned the patient's phone call.Told her it would be ok to proceed with her procedure tomorrow but to take Tylenol if she has pain until after the procedure is over.

## 2022-11-18 ENCOUNTER — Ambulatory Visit (AMBULATORY_SURGERY_CENTER): Payer: Managed Care, Other (non HMO) | Admitting: Gastroenterology

## 2022-11-18 ENCOUNTER — Encounter: Payer: Self-pay | Admitting: Gastroenterology

## 2022-11-18 VITALS — BP 96/52 | HR 78 | Temp 98.1°F | Resp 18 | Ht 63.0 in | Wt 140.0 lb

## 2022-11-18 DIAGNOSIS — Z1211 Encounter for screening for malignant neoplasm of colon: Secondary | ICD-10-CM | POA: Diagnosis present

## 2022-11-18 DIAGNOSIS — Z800 Family history of malignant neoplasm of digestive organs: Secondary | ICD-10-CM

## 2022-11-18 MED ORDER — SODIUM CHLORIDE 0.9 % IV SOLN: 500.0000 mL | INTRAVENOUS | Status: AC

## 2022-11-18 NOTE — Progress Notes (Signed)
Pt's states no medical or surgical changes since previsit or office visit. 

## 2022-11-18 NOTE — Progress Notes (Signed)
Sedate, gd SR, tolerated procedure well, VSS, report to RN 

## 2022-11-18 NOTE — Progress Notes (Signed)
[facility] History and Physical   Primary Care Physician:  Noni Saupe, MD   Reason for Procedure:   FH CRC dad at age 57, H/O polyps  Plan:    colon     HPI: Holly Deleon is a 57 y.o. female    Past Medical History:  Diagnosis Date   Abnormal cervical Papanicolaou smear 02/08/2018   Allergic rhinitis    Anxiety    Arthritis    Asthma    Chronic sinusitis    Herniated cervical disc 03/10/2020   Hyperlipidemia    Hyperthyroidism 04/01/2020   IBS (irritable bowel syndrome)    Irritable bowel syndrome 02/08/2018   Palpitations 02/01/2019   Sleep apnea    Syncope and collapse 02/01/2019   Tobacco use 02/01/2019   Vertigo     Past Surgical History:  Procedure Laterality Date   COLONOSCOPY  01/22/2016   Colon Polyp(s). Internal hemorrhoids   LAPAROSCOPIC HYSTERECTOMY     SINOSCOPY      Prior to Admission medications   Medication Sig Start Date End Date Taking? Authorizing Provider  cyclobenzaprine (FLEXERIL) 10 MG tablet Take 10 mg by mouth every 8 (eight) hours as needed. 07/26/22  Yes [provider]  Evolocumab (REPATHA SURECLICK) 140 MG/ML SOAJ Inject 140 mg into the skin every 14 (fourteen) days. 09/19/22  Yes Tobb, Kardie, DO  ipratropium-albuterol (DUONEB) 0.5-2.5 (3) MG/3ML SOLN Take 3 mLs by nebulization every 6 (six) hours as needed. 10/14/21  Yes Lupita Leash, MD  loratadine (CLARITIN) 10 MG tablet Take 10 mg by mouth daily.   Yes [provider]  albuterol (VENTOLIN HFA) 108 (90 Base) MCG/ACT inhaler INHALE 1-2 PUFFS BY MOUTH EVERY 6 HOURS AS NEEDED FOR WHEEZE OR SHORTNESS OF BREATH 10/12/22   Mannam, Praveen, MD  amitriptyline (ELAVIL) 25 MG tablet Take 25 mg by mouth at bedtime. 06/01/22   [provider]  cyclobenzaprine (FLEXERIL) 5 MG tablet Take 5 mg by mouth daily as needed. 05/31/22   [provider]  diclofenac (VOLTAREN) 75 MG EC tablet Take 75 mg by mouth daily as needed. 07/13/22    [provider]  dicyclomine (BENTYL) 10 MG capsule Take 10 mg by mouth 4 (four) times daily as needed.    [provider]    Current Outpatient Medications  Medication Sig Dispense Refill   cyclobenzaprine (FLEXERIL) 10 MG tablet Take 10 mg by mouth every 8 (eight) hours as needed.     Evolocumab (REPATHA SURECLICK) 140 MG/ML SOAJ Inject 140 mg into the skin every 14 (fourteen) days. 6 mL 3   ipratropium-albuterol (DUONEB) 0.5-2.5 (3) MG/3ML SOLN Take 3 mLs by nebulization every 6 (six) hours as needed. 360 mL 5   loratadine (CLARITIN) 10 MG tablet Take 10 mg by mouth daily.     albuterol (VENTOLIN HFA) 108 (90 Base) MCG/ACT inhaler INHALE 1-2 PUFFS BY MOUTH EVERY 6 HOURS AS NEEDED FOR WHEEZE OR SHORTNESS OF BREATH 8.5 each 0   amitriptyline (ELAVIL) 25 MG tablet Take 25 mg by mouth at bedtime.     cyclobenzaprine (FLEXERIL) 5 MG tablet Take 5 mg by mouth daily as needed.     diclofenac (VOLTAREN) 75 MG EC tablet Take 75 mg by mouth daily as needed.     dicyclomine (BENTYL) 10 MG capsule Take 10 mg by mouth 4 (four) times daily as needed.     Current Facility-Administered Medications  Medication Dose Route Frequency Provider Last Rate Last Admin   0.9 %  sodium chloride infusion  500 mL Intravenous Continuous Lynann Bologna, MD        Allergies as of 11/18/2022 - Review Complete 11/18/2022  Allergen Reaction Noted   Anoro ellipta [umeclidinium-vilanterol] Other (See Comments) 10/14/2021   Atorvastatin Other (See Comments) 09/12/2022   Penicillins Other (See Comments) 10/20/2013   Rosuvastatin  09/12/2022    Family History  Problem Relation Age of Onset   Allergic rhinitis Mother    Arthritis Mother    Irritable bowel syndrome Mother    High Cholesterol Father    Colon cancer Father    Stroke Maternal Aunt    Heart attack Maternal Uncle    Stroke Maternal Grandmother    Irritable bowel syndrome Maternal Grandmother    Heart attack Maternal Grandfather     Colon polyps Son    Allergic rhinitis Son    Irritable bowel syndrome Niece    Esophageal cancer Neg Hx    Rectal cancer Neg Hx    Stomach cancer Neg Hx     Social History   Socioeconomic History   Marital status: Single    Spouse name: Not on file   Number of children: Not on file   Years of education: Not on file   Highest education level: Not on file  Occupational History   Not on file  Tobacco Use   Smoking status: Former    Current packs/day: 0.00    Types: Cigarettes    Quit date: 07/15/2021    Years since quitting: 1.3   Smokeless tobacco: Never  Vaping Use   Vaping status: Every Day   Start date: 01/03/2021   Devices: geekbar zero  Substance and Sexual Activity   Alcohol use: Yes    Comment: Rare, special occasion   Drug use: Not Currently   Sexual activity: Not on file  Other Topics Concern   Not on file  Social History Narrative   Not on file   Social Determinants of Health   Financial Resource Strain: Not on file  Food Insecurity: Not on file  Transportation Needs: Not on file  Physical Activity: Not on file  Stress: Not on file  Social Connections: Not on file  Intimate Partner Violence: Not on file    Review of Systems: Positive for none All other review of systems negative except as mentioned in the HPI.  Physical Exam: Vital signs in last 24 hours: @VSRANGES @   General:   Alert,  Well-developed, well-nourished, pleasant and cooperative in NAD Lungs:  Clear throughout to auscultation.   Heart:  Regular rate and rhythm; no murmurs, clicks, rubs,  or gallops. Abdomen:  Soft, nontender and nondistended. Normal bowel sounds.   Neuro/Psych:  Alert and cooperative. Normal mood and affect. A and O x 3    No significant changes were identified.  The patient continues to be an appropriate candidate for the planned procedure and anesthesia.   Edman Circle, MD. [facility] 11/18/2022 2:10 PM@

## 2022-11-18 NOTE — Op Note (Addendum)
[facility] Patient Name: Holly Deleon Procedure Date: 11/18/2022 9:40 AM MRN: [id number] Endoscopist: Lynann Bologna , MD, [id number] Age: 57 Referring MD:  Date of Birth: [birth year] Gender: Female Account #: [id number] Procedure:                Colonoscopy Indications:              Screening in patient at increased risk: Colorectal                            cancer in father at age 44. H/O polyps. Medicines:                Monitored Anesthesia Care Procedure:                Pre-Anesthesia Assessment:                           - Prior to the procedure, a History and Physical                            was performed, and patient medications and                            allergies were reviewed. The patient's tolerance of                            previous anesthesia was also reviewed. The risks                            and benefits of the procedure and the sedation                            options and risks were discussed with the patient.                            All questions were answered, and informed consent                            was obtained. Prior Anticoagulants: The patient has                            taken no anticoagulant or antiplatelet agents. ASA                            Grade Assessment: II - A patient with mild systemic                            disease. After reviewing the risks and benefits,                            the patient was deemed in satisfactory condition to                            undergo the procedure.  After obtaining informed consent, the colonoscope                            was passed under direct vision. Throughout the                            procedure, the patient's blood pressure, pulse, and                            oxygen saturations were monitored continuously. The                            PCF-HQ190L Colonoscope [id number] was introduced                            through the anus and  advanced to the 2 cm into the                            ileum. The colonoscopy was performed without                            difficulty. The patient tolerated the procedure                            well. The quality of the bowel preparation was                            good. The terminal ileum, ileocecal valve,                            appendiceal orifice, and rectum were photographed. Scope In: 10:07:52 AM Scope Out: 10:21:56 AM Scope Withdrawal Time: 0 hours 8 minutes 25 seconds  Total Procedure Duration: 0 hours 14 minutes 4 seconds  Findings:                 A few small-mouthed diverticula were found in the                            sigmoid colon.                           Non-bleeding internal hemorrhoids were found during                            retroflexion. The hemorrhoids were small and Grade                            I (internal hemorrhoids that do not prolapse).                           The terminal ileum appeared normal.                           The exam was otherwise without abnormality on  direct and retroflexion views. Complications:            No immediate complications. Estimated Blood Loss:     Estimated blood loss: none. Impression:               - Minimal sigmoid diverticulosis.                           - Non-bleeding internal hemorrhoids.                           - The examined portion of the ileum was normal.                           - The examination was otherwise normal on direct                            and retroflexion views.                           - No specimens collected. Recommendation:           - Patient has a contact number available for                            emergencies. The signs and symptoms of potential                            delayed complications were discussed with the                            patient. Return to normal activities tomorrow.                            Written discharge  instructions were provided to the                            patient.                           - Resume previous diet.                           - Continue present medications.                           - Repeat colonoscopy in 5 years for screening                            purposes d/t previous history of multiple polyps                            and family history of CRC.                           - The findings and recommendations were discussed  with the patient's family. Lynann Bologna, MD 11/18/2022 10:26:27 AM This report has been signed electronically.

## 2022-11-18 NOTE — Patient Instructions (Signed)
YOU HAD AN ENDOSCOPIC PROCEDURE TODAY AT THE [facility]:   Refer to the procedure report that was given to you for any specific questions about what was found during the examination.  If the procedure report does not answer your questions, please call your gastroenterologist to clarify.  If you requested that your care partner not be given the details of your procedure findings, then the procedure report has been included in a sealed envelope for you to review at your convenience later.  YOU SHOULD EXPECT: Some feelings of bloating in the abdomen. Passage of more gas than usual.  Walking can help get rid of the air that was put into your GI tract during the procedure and reduce the bloating. If you had a lower endoscopy (such as a colonoscopy or flexible sigmoidoscopy) you may notice spotting of blood in your stool or on the toilet paper. If you underwent a bowel prep for your procedure, you may not have a normal bowel movement for a few days.  Please Note:  You might notice some irritation and congestion in your nose or some drainage.  This is from the oxygen used during your procedure.  There is no need for concern and it should clear up in a day or so.  SYMPTOMS TO REPORT IMMEDIATELY:  Following lower endoscopy (colonoscopy or flexible sigmoidoscopy):  Excessive amounts of blood in the stool  Significant tenderness or worsening of abdominal pains  Swelling of the abdomen that is new, acute  Fever of 100F or higher   For urgent or emergent issues, a gastroenterologist can be reached at any hour by calling [phone]. Do not use MyChart messaging for urgent concerns.    DIET:  We do recommend a small meal at first, but then you may proceed to your regular diet.  Drink plenty of fluids but you should avoid alcoholic beverages for 24 hours.  MEDICATIONS: Continue present medications.  FOLLOW UP: Repeat colonoscopy in 5 years for screening purposes due to previous history of  multiple polyps and family history of colorectal cancer.  Please see handouts given to you by your recovery nurse: Diverticulosis, Hemorrhoids.  Thank you for allowing [place] to provide for your healthcare needs today.  ACTIVITY:  You should plan to take it easy for the rest of today and you should NOT DRIVE or use heavy machinery until tomorrow (because of the sedation medicines used during the test).    FOLLOW UP: Our staff will call the number listed on your records the next business day following your procedure.  We will call around 7:15- 8:00 am to check on you and address any questions or concerns that you may have regarding the information given to you following your procedure. If we do not reach you, we will leave a message.     If any biopsies were taken you will be contacted by phone or by letter within the next 1-3 weeks.  Please call us at [phone] if you have not heard about the biopsies in 3 weeks.    SIGNATURES/CONFIDENTIALITY: You and/or your care partner have signed paperwork which will be entered into your electronic medical record.  These signatures attest to the fact that that the information above on your After Visit Summary has been reviewed and is understood.  Full responsibility of the confidentiality of this discharge information lies with you and/or your care-partner.

## 2022-11-21 ENCOUNTER — Telehealth: Payer: Self-pay

## 2022-11-21 NOTE — Telephone Encounter (Signed)
Left message on answering machine. 

## 2022-12-05 ENCOUNTER — Other Ambulatory Visit: Payer: Self-pay

## 2022-12-05 DIAGNOSIS — Z122 Encounter for screening for malignant neoplasm of respiratory organs: Secondary | ICD-10-CM

## 2022-12-05 DIAGNOSIS — Z87891 Personal history of nicotine dependence: Secondary | ICD-10-CM

## 2022-12-09 ENCOUNTER — Telehealth: Payer: Self-pay | Admitting: Pharmacist Clinician (PhC)/ Clinical Pharmacy Specialist

## 2022-12-09 DIAGNOSIS — E559 Vitamin D deficiency, unspecified: Secondary | ICD-10-CM

## 2022-12-09 DIAGNOSIS — E785 Hyperlipidemia, unspecified: Secondary | ICD-10-CM

## 2022-12-09 NOTE — Telephone Encounter (Signed)
Lipid orders sent

## 2022-12-13 NOTE — Addendum Note (Signed)
Addended by: Rosalee Kaufman on: 12/13/2022 06:54 AM   Modules accepted: Orders

## 2022-12-15 LAB — LIPID PANEL
Chol/HDL Ratio: 2 {ratio} (ref 0.0–4.4)
Cholesterol, Total: 184 mg/dL (ref 100–199)
HDL: 90 mg/dL (ref >39)
LDL Chol Calc (NIH): 83 mg/dL (ref 0–99)
Triglycerides: 60 mg/dL (ref 0–149)
VLDL Cholesterol Cal: 11 mg/dL (ref 5–40)

## 2022-12-15 LAB — VITAMIN D 25 HYDROXY (VIT D DEFICIENCY, FRACTURES): Vit D, 25-Hydroxy: 40.6 ng/mL (ref 30.0–100.0)

## 2022-12-15 LAB — HEPATIC FUNCTION PANEL
ALT: 22 [IU]/L (ref 0–32)
AST: 15 [IU]/L (ref 0–40)
Albumin: 4.5 g/dL (ref 3.8–4.9)
Alkaline Phosphatase: 77 [IU]/L (ref 44–121)
Bilirubin Total: 0.3 mg/dL (ref 0.0–1.2)
Bilirubin, Direct: 0.13 mg/dL (ref 0.00–0.40)
Total Protein: 6.9 g/dL (ref 6.0–8.5)

## 2023-02-20 ENCOUNTER — Ambulatory Visit: Payer: Managed Care, Other (non HMO) | Admitting: Cardiology

## 2023-02-20 ENCOUNTER — Other Ambulatory Visit: Payer: Self-pay | Admitting: Pulmonary Disease

## 2023-02-28 ENCOUNTER — Telehealth: Payer: Self-pay | Admitting: Acute Care

## 2023-02-28 NOTE — Telephone Encounter (Signed)
 MyChart Message:   My nebulizer solution really gives me the shacks is there anything else we could try & my inhaler needs a refill

## 2023-03-02 MED ORDER — ALBUTEROL SULFATE HFA 108 (90 BASE) MCG/ACT IN AERS: 1.0000 | INHALATION_SPRAY | Freq: Four times a day (QID) | RESPIRATORY_TRACT | 1 refills | Status: DC | PRN

## 2023-03-02 NOTE — Telephone Encounter (Signed)
 Pt states her Duoneb makes her shake and feel hyper. Pt states the neb has usually made her shake but the shaking has worsened. Pt states she has used her emergency inhaler due to her breathing becoming worse. Pt states she feels sob more. Pt needs a refill on Albuterol inhaler. I informed pt that we have not seen her since 10/2021 with Dr Henrene Pastor. I informed pt I could send a courtesy refill for the albuerol but she must have an appointment with a new DR since [facility] is no longer here. Pt verbalized understanding. Routing to [employer], NP since she is dod today to advise the duoneb issue. Courtesy refill sent.   Also routing to front desk to have pt scheduled with a new pulmonologist.

## 2023-03-09 ENCOUNTER — Ambulatory Visit: Payer: Managed Care, Other (non HMO) | Admitting: Internal Medicine

## 2023-03-09 ENCOUNTER — Encounter: Payer: Self-pay | Admitting: Internal Medicine

## 2023-03-09 VITALS — BP 108/70 | HR 69 | Ht 63.0 in | Wt 153.0 lb

## 2023-03-09 DIAGNOSIS — G4733 Obstructive sleep apnea (adult) (pediatric): Secondary | ICD-10-CM | POA: Diagnosis not present

## 2023-03-09 DIAGNOSIS — J449 Chronic obstructive pulmonary disease, unspecified: Secondary | ICD-10-CM

## 2023-03-09 MED ORDER — ALBUTEROL SULFATE HFA 108 (90 BASE) MCG/ACT IN AERS: 1.0000 | INHALATION_SPRAY | Freq: Four times a day (QID) | RESPIRATORY_TRACT | 10 refills | Status: DC | PRN

## 2023-03-09 MED ORDER — IPRATROPIUM-ALBUTEROL 0.5-2.5 (3) MG/3ML IN SOLN: 3.0000 mL | Freq: Four times a day (QID) | RESPIRATORY_TRACT | 12 refills | Status: DC | PRN

## 2023-03-09 NOTE — Patient Instructions (Signed)
 Continue albuterol as needed Continue nebulizers as needed Follow-up lung cancer screening protocol  Avoid Allergens and Irritants Avoid secondhand smoke Avoid SICK contacts Recommend  Masking  when appropriate Recommend Keep up-to-date with vaccinations

## 2023-03-09 NOTE — Progress Notes (Signed)
 [facility] [place] [facility] Consultation      TESTS Chest imaging: October 2023 low-dose screening CT scan showed upper lobe predominant emphysema, bronchial wall thickening, RADS 1S PFT: 2019 spirometry performed by asthma and allergy: Ratio 67% FEV1 2.2L 83% pred 2023 spirometry normal Ratio 77% FEV1 2.3L (performed by PCP)  Echo: 03/2019 TTE > LVEF 60-65%, RV size and function normal  CHIEF COMPLAINT:   Assessment of sleep apnea Assessment of shortness of breath    HISTORY OF PRESENT ILLNESS      Follow-up assessment for COPD She says that the Anoro made her feel poorly, and gave her mouth ulcers.  She says she doesn't sleep well.  She still has a lot of fatigue in the afternoons She doesn't sleep well.  She knows she has sleep apnea but she doesn't want to use CPAP. She hasn't slept well in years.   Patient uses albuterol as needed Patient uses DuoNebs as needed Patient refusing long-acting inhaler therapy  No exacerbation at this time No evidence of heart failure at this time No evidence or signs of infection at this time No respiratory distress No fevers, chills, nausea, vomiting, diarrhea No evidence of lower extremity edema No evidence hemoptysis   Assessment of OSA Patient with a previous AHI of 22 Patient has refused CPAP in the past     PAST MEDICAL HISTORY   Past Medical History:  Diagnosis Date   Abnormal cervical Papanicolaou smear 02/08/2018   Allergic rhinitis    Anxiety    Arthritis    Asthma    Chronic sinusitis    Herniated cervical disc 03/10/2020   Hyperlipidemia    Hyperthyroidism 04/01/2020   IBS (irritable bowel syndrome)    Irritable bowel syndrome 02/08/2018   Palpitations 02/01/2019   Sleep apnea    Syncope and collapse 02/01/2019   Tobacco use 02/01/2019   Vertigo      SURGICAL HISTORY   Past Surgical History:  Procedure Laterality Date   COLONOSCOPY  01/22/2016   Colon Polyp(s). Internal hemorrhoids    LAPAROSCOPIC HYSTERECTOMY     SINOSCOPY       FAMILY HISTORY   Family History  Problem Relation Age of Onset   Allergic rhinitis Mother    Arthritis Mother    Irritable bowel syndrome Mother    High Cholesterol Father    Colon cancer Father    Stroke Maternal Aunt    Heart attack Maternal Uncle    Stroke Maternal Grandmother    Irritable bowel syndrome Maternal Grandmother    Heart attack Maternal Grandfather    Colon polyps Son    Allergic rhinitis Son    Irritable bowel syndrome Niece    Esophageal cancer Neg Hx    Rectal cancer Neg Hx    Stomach cancer Neg Hx      SOCIAL HISTORY   Social History   Tobacco Use   Smoking status: Former    Current packs/day: 0.00    Types: Cigarettes    Quit date: 07/15/2021    Years since quitting: 1.6   Smokeless tobacco: Never  Vaping Use   Vaping status: Every Day   Start date: 01/03/2021   Devices: geekbar zero  Substance Use Topics   Alcohol use: Yes    Comment: Rare, special occasion   Drug use: Not Currently     MEDICATIONS    Home Medication:  Current Outpatient Rx   Order #: [id number] Class: Normal   Order #: [id number] Class: Historical Med   Order #:  [id number] Class: Historical Med   Order #: [id number] Class: Historical Med   Order #: [id number] Class: Historical Med   Order #: [id number] Class: Historical Med   Order #: [id number] Class: Normal   Order #: [id number] Class: Normal   Order #: [id number] Class: Historical Med    Current Medication:  Current Outpatient Medications:    albuterol (VENTOLIN HFA) 108 (90 Base) MCG/ACT inhaler, Inhale 1-2 puffs into the lungs every 6 (six) hours as needed for wheezing or shortness of breath., Disp: 18 g, Rfl: 1   amitriptyline (ELAVIL) 25 MG tablet, Take 25 mg by mouth at bedtime., Disp: , Rfl:    cyclobenzaprine (FLEXERIL) 10 MG tablet, Take 10 mg by mouth every 8 (eight) hours as needed., Disp: , Rfl:    cyclobenzaprine (FLEXERIL) 5 MG tablet, Take 5 mg by mouth daily as  needed., Disp: , Rfl:    diclofenac (VOLTAREN) 75 MG EC tablet, Take 75 mg by mouth daily as needed., Disp: , Rfl:    dicyclomine (BENTYL) 10 MG capsule, Take 10 mg by mouth 4 (four) times daily as needed., Disp: , Rfl:    Evolocumab (REPATHA SURECLICK) 140 MG/ML SOAJ, Inject 140 mg into the skin every 14 (fourteen) days., Disp: 6 mL, Rfl: 3   ipratropium-albuterol (DUONEB) 0.5-2.5 (3) MG/3ML SOLN, Take 3 mLs by nebulization every 6 (six) hours as needed., Disp: 360 mL, Rfl: 5   loratadine (CLARITIN) 10 MG tablet, Take 10 mg by mouth daily., Disp: , Rfl:     ALLERGIES   Anoro ellipta [umeclidinium-vilanterol], Atorvastatin, Penicillins, and Rosuvastatin     Review of Systems: Gen:  Denies  fever, sweats, chills weight loss  HEENT: Denies blurred vision, double vision, ear pain, eye pain, hearing loss, nose bleeds, sore throat Cardiac:  No dizziness, chest pain or heaviness, chest tightness,edema, No JVD Resp:   No cough, -sputum production, -shortness of breath,-wheezing, -hemoptysis,  Other:  All other systems negative   Physical Examination:   General Appearance: No distress  EYES PERRLA, EOM intact.   NECK Supple, No JVD Pulmonary: normal breath sounds, No wheezing.  CardiovascularNormal S1,S2.  No m/r/g.   Abdomen: Benign, Soft, non-tender. Neurology UE/LE 5/5 strength, no focal deficits Ext pulses intact, cap refill intact ALL OTHER ROS ARE NEGATIVE       ASSESSMENT/PLAN   58 year old white female seen today for follow-up assessment for noncompliant OSA and COPD  No exacerbation at this time No evidence of heart failure at this time No evidence or signs of infection at this time No respiratory distress No fevers, chills, nausea, vomiting, diarrhea No evidence of lower extremity edema No evidence hemoptysis  Continue albuterol as needed Continue nebulizers as needed Follow-up lung cancer screening protocol  Avoid Allergens and Irritants Avoid secondhand  smoke Avoid SICK contacts Recommend  Masking  when appropriate Recommend Keep up-to-date with vaccinations    CURRENT MEDICATIONS REVIEWED AT LENGTH WITH PATIENT TODAY   Patient  satisfied with Plan of action and management. All questions answered  Follow up 1 year  I spent a total of 41 minutes reviewing chart data, face-to-face evaluation with the patient, counseling and coordination of care as detailed above.     Lucie Leather, M.D.  [facility]  Medical Director [facility] Medical Director [facility] Cardio-Pulmonary Department

## 2023-03-27 ENCOUNTER — Encounter: Payer: Self-pay | Admitting: Cardiology

## 2023-03-27 ENCOUNTER — Ambulatory Visit: Payer: Managed Care, Other (non HMO) | Attending: Cardiology | Admitting: Cardiology

## 2023-03-27 VITALS — BP 100/70 | HR 57 | Ht 63.0 in | Wt 152.6 lb

## 2023-03-27 DIAGNOSIS — R002 Palpitations: Secondary | ICD-10-CM

## 2023-03-27 DIAGNOSIS — I2510 Atherosclerotic heart disease of native coronary artery without angina pectoris: Secondary | ICD-10-CM

## 2023-03-27 DIAGNOSIS — E782 Mixed hyperlipidemia: Secondary | ICD-10-CM

## 2023-03-27 DIAGNOSIS — G4730 Sleep apnea, unspecified: Secondary | ICD-10-CM

## 2023-03-27 DIAGNOSIS — Z79899 Other long term (current) drug therapy: Secondary | ICD-10-CM | POA: Diagnosis not present

## 2023-03-27 NOTE — Patient Instructions (Signed)
 Medication Instructions:  Your physician recommends that you continue on your current medications as directed. Please refer to the Current Medication list given to you today.  *If you need a refill on your cardiac medications before your next appointment, please call your pharmacy*   Lab Work: CMET, Mag If you have labs (blood work) drawn today and your tests are completely normal, you will receive your results only by: MyChart Message (if you have MyChart) OR A paper copy in the mail If you have any lab test that is abnormal or we need to change your treatment, we will call you to review the results.  Follow-Up: At [facility], you and your health needs are our priority.  As part of our continuing mission to provide you with exceptional heart care, we have created designated Provider Care Teams.  These Care Teams include your primary Cardiologist (physician) and Advanced Practice Providers (APPs -  Physician Assistants and Nurse Practitioners) who all work together to provide you with the care you need, when you need it.   Your next appointment:   1 year(s)  Provider:   Thomasene Ripple, DO

## 2023-03-27 NOTE — Progress Notes (Signed)
 Cardiology Office Note:    Date:  03/27/2023   ID:  Holly Deleon, DOB Nov 30, 1965, MRN [id number]  PCP:  Noni Saupe, MD  Cardiologist:  Thomasene Ripple, DO  Electrophysiologist:  None   Referring MD: Noni Saupe, MD   " I am ok"  History of Present Illness:    Holly Deleon is a 58 y.o. 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  At her last visit referred the patient to [facility].  During that time she was transition off of Crestor to atorvastatin 10 mg daily.  She is here today for follow-up visit.   She reports that her back pain is due to arthritis, which is hereditary and has affected her entire spine. She describes the pain as severe and notes that her L4 and L5 vertebrae are particularly affected. She also reports experiencing muscle cramps, particularly in her feet, which she initially attributed to her arthritis. However, she notes that these cramps have been occurring more frequently and are now also affecting her lower abdomen. She has been trying to manage these cramps by eating a banana every day for its potassium content. She also reports that she has been prescribed Repatha for her hyperlipidemia, which she tolerates well and prefers over previous statin medications due to less muscle pain.   Past Medical History:  Diagnosis Date   Abnormal cervical Papanicolaou smear 02/08/2018   Allergic rhinitis    Anxiety    Arthritis    Asthma    Chronic sinusitis    Herniated cervical disc 03/10/2020   Hyperlipidemia    Hyperthyroidism 04/01/2020   IBS (irritable bowel syndrome)    Irritable bowel syndrome 02/08/2018   Palpitations 02/01/2019   Sleep apnea    Syncope and collapse 02/01/2019   Tobacco use 02/01/2019   Vertigo     Past Surgical History:  Procedure Laterality Date   COLONOSCOPY  01/22/2016   Colon Polyp(s).  Internal hemorrhoids   LAPAROSCOPIC HYSTERECTOMY     SINOSCOPY      Current Medications: Current Meds  Medication Sig   albuterol (VENTOLIN HFA) 108 (90 Base) MCG/ACT inhaler Inhale 1-2 puffs into the lungs every 6 (six) hours as needed for wheezing or shortness of breath.   amitriptyline (ELAVIL) 25 MG tablet Take 25 mg by mouth at bedtime.   dicyclomine (BENTYL) 10 MG capsule Take 10 mg by mouth 4 (four) times daily as needed.   Evolocumab (REPATHA SURECLICK) 140 MG/ML SOAJ Inject 140 mg into the skin every 14 (fourteen) days.   ipratropium-albuterol (DUONEB) 0.5-2.5 (3) MG/3ML SOLN Take 3 mLs by nebulization every 6 (six) hours as needed.   loratadine (CLARITIN) 10 MG tablet Take 10 mg by mouth daily.     Allergies:   Anoro ellipta [umeclidinium-vilanterol], Atorvastatin, Penicillins, and Rosuvastatin   Social History   Socioeconomic History   Marital status: Single    Spouse name: Not on file   Number of children: Not on file   Years of education: Not on file   Highest education level: Not on file  Occupational History   Not on file  Tobacco Use   Smoking status: Former    Current packs/day: 0.00    Types: Cigarettes    Quit date: 07/15/2021    Years since quitting: 1.6   Smokeless tobacco: Never  Vaping Use   Vaping status: Every Day  Start date: 01/03/2021   Devices: [email or web address] zero  Substance and Sexual Activity   Alcohol use: Yes    Comment: Rare, special occasion   Drug use: Not Currently   Sexual activity: Not on file  Other Topics Concern   Not on file  Social History Narrative   Not on file   Social Drivers of Health   Financial Resource Strain: Not on file  Food Insecurity: Not on file  Transportation Needs: Not on file  Physical Activity: Not on file  Stress: Not on file  Social Connections: Not on file     Family History: The patient's family history includes Allergic rhinitis in her mother and son; Arthritis in her mother; Colon cancer in her  father; Colon polyps in her son; Heart attack in her maternal grandfather and maternal uncle; High Cholesterol in her father; Irritable bowel syndrome in her maternal grandmother, mother, and niece; Stroke in her maternal aunt and maternal grandmother. There is no history of Esophageal cancer, Rectal cancer, or Stomach cancer.  ROS:   Review of Systems  Constitution: Negative for decreased appetite, fever and weight gain.  HENT: Negative for congestion, ear discharge, hoarse voice and sore throat.   Eyes: Negative for discharge, redness, vision loss in right eye and visual halos.  Cardiovascular: Negative for chest pain, dyspnea on exertion, leg swelling, orthopnea and palpitations.  Respiratory: Negative for cough, hemoptysis, shortness of breath and snoring.   Endocrine: Negative for heat intolerance and polyphagia.  Hematologic/Lymphatic: Negative for bleeding problem. Does not bruise/bleed easily.  Skin: Negative for flushing, nail changes, rash and suspicious lesions.  Musculoskeletal: Negative for arthritis, joint pain, muscle cramps, myalgias, neck pain and stiffness.  Gastrointestinal: Negative for abdominal pain, bowel incontinence, diarrhea and excessive appetite.  Genitourinary: Negative for decreased libido, genital sores and incomplete emptying.  Neurological: Negative for brief paralysis, focal weakness, headaches and loss of balance.  Psychiatric/Behavioral: Negative for altered mental status, depression and suicidal ideas.  Allergic/Immunologic: Negative for HIV exposure and persistent infections.    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 Right dominance.   Coronary Calcium Score:   Left main: 0   Left anterior descending artery: 0   Left circumflex artery: 11   Right coronary artery: 0   Ramus intermedius  artery: 0   Total: 11   Percentile: 82nd for age, sex, and race matched control.   RCA is a large dominant artery that gives rise to PDA and PLA. Arises from the left cusp. With a malignant course. No intramural course. There is an acute angle take off. There is a slit like orifice.   Left main is a large artery that gives rise to LAD and LCX arteries. There is no significant plaque.   LAD is gives rise to two diagonal vessels. There is no significant plaque.   LCX is a non-dominant artery that gives rise to two OM vessels. Minimal calcified plaque mLCX.   Other findings:   Aorta: Normal size.  No calcifications.  No dissection.   Systemic Veins: Normal drainage   Aortic Valve:  Tri-leaflet.  No calcifications.   Mitral valve: No calcifications.   Normal pulmonary vein drainage into the left atrium.   Normal left atrial appendage without a thrombus (incompletely imaged- chicken wing morphology with superior portion excluded from field of view)   Interatrial septum  notable for small PFO   Left Ventricle: Normal size   Left Atrium: Normal size   Right Ventricle: Normal size   Right Atrium: Normal size   Pericardium: Normal thickness   Extra-cardiac findings: See attached radiology report for non-cardiac structures.   Artifact: Patient motion slab artifact.   IMPRESSION: 1. Coronary calcium score of 11. This was 82nd percentile for age, sex, and race matched control.   2. Normal coronary origin with Right dominance.   3. 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 Will send FFR, as ischemia may change management.   4. CAD-RADS 1 otherwise.   Recent Labs: 12/15/2022: ALT 22  Recent Lipid Panel    Component Value Date/Time   CHOL 184 12/15/2022 0902   TRIG 60 12/15/2022 0902   HDL 90 12/15/2022 0902   CHOLHDL 2.0 12/15/2022 0902   LDLCALC 83 12/15/2022 0902    Physical Exam:    VS:  BP 100/70 (BP Location: Right Arm, Patient  Position: Sitting, Cuff Size: Normal)   Pulse (!) 57   Ht 5\' 3"  (1.6 m)   Wt 152 lb 9.6 oz (69.2 kg)   SpO2 92%   BMI 27.03 kg/m     Wt Readings from Last 3 Encounters:  03/27/23 152 lb 9.6 oz (69.2 kg)  03/09/23 153 lb (69.4 kg)  11/18/22 140 lb (63.5 kg)     GEN: Well nourished, well developed in no acute distress HEENT: Normal NECK: No JVD; No carotid bruits LYMPHATICS: No lymphadenopathy CARDIAC: S1S2 noted,RRR, no murmurs, rubs, gallops RESPIRATORY:  Clear to auscultation without rales, wheezing or rhonchi  ABDOMEN: Soft, non-tender, non-distended, +bowel sounds, no guarding. EXTREMITIES: No edema, No cyanosis, no clubbing MUSCULOSKELETAL:  No deformity  SKIN: Warm and dry NEUROLOGIC:  Alert and oriented x 3, non-focal PSYCHIATRIC:  Normal affect, good insight  ASSESSMENT:    1. Palpitations   2. Medication management   3. Sleep apnea, unspecified type   4. Mixed hyperlipidemia   5. Minimal CAD    PLAN:     Minimal CAD - no anginal symptoms  Hyperlipidemia-she is tolerating the Repatha.   Blood work today for [employer] and [employer].  The patient is in agreement with the above plan. The patient left the office in stable condition.  The patient will follow up in 1 year or sooner if needed.   Medication Adjustments/Labs and Tests Ordered: Current medicines are reviewed at length with the patient today.  Concerns regarding medicines are outlined above.  Orders Placed This Encounter  Procedures   Comprehensive Metabolic Panel (CMET)   Magnesium   EKG 12-Lead   No orders of the defined types were placed in this encounter.   Patient Instructions  Medication Instructions:  Your physician recommends that you continue on your current medications as directed. Please refer to the Current Medication list given to you today.  *If you need a refill on your cardiac medications before your next appointment, please call your pharmacy*   Lab Work: CMET, Mag If you have labs  (blood work) drawn today and your tests are completely normal, you will receive your results only by: MyChart Message (if you have MyChart) OR A paper copy in the mail If you have any lab test that is abnormal or we need to change your treatment, we will call you to review the results.   Follow-Up: At [facility], you and your health needs are our priority.  As part of our continuing mission to  provide you with exceptional heart care, we have created designated Provider Care Teams.  These Care Teams include your primary Cardiologist (physician) and Advanced Practice Providers (APPs -  Physician Assistants and Nurse Practitioners) who all work together to provide you with the care you need, when you need it.   Your next appointment:   1 year(s)  Provider:   Thomasene Ripple, DO         Adopting a Healthy Lifestyle.  Know what a healthy weight is for you (roughly BMI <25) and aim to maintain this   Aim for 7+ servings of fruits and vegetables daily   65-80+ fluid ounces of water or unsweet tea for healthy kidneys   Limit to max 1 drink of alcohol per day; avoid smoking/tobacco   Limit animal fats in diet for cholesterol and heart health - choose grass fed whenever available   Avoid highly processed foods, and foods high in saturated/trans fats   Aim for low stress - take time to unwind and care for your mental health   Aim for 150 min of moderate intensity exercise weekly for heart health, and weights twice weekly for bone health   Aim for 7-9 hours of sleep daily   When it comes to diets, agreement about the perfect plan isnt easy to find, even among the experts. Experts at the [facility] [employer] developed an idea known as the Healthy Eating Plate. Just imagine a plate divided into logical, healthy portions.   The emphasis is on diet quality:   Load up on vegetables and fruits - one-half of your plate: Aim for color and variety, and remember that potatoes  dont count.   Go for whole grains - one-quarter of your plate: Whole wheat, barley, wheat berries, quinoa, oats, brown rice, and foods made with them. If you want pasta, go with whole wheat pasta.   Protein power - one-quarter of your plate: Fish, chicken, beans, and nuts are all healthy, versatile protein sources. Limit red meat.   The diet, however, does go beyond the plate, offering a few other suggestions.   Use healthy plant oils, such as olive, canola, soy, corn, sunflower and peanut. Check the labels, and avoid partially hydrogenated oil, which have unhealthy trans fats.   If youre thirsty, drink water. Coffee and tea are good in moderation, but skip sugary drinks and limit milk and dairy products to one or two daily servings.   The type of carbohydrate in the diet is more important than the amount. Some sources of carbohydrates, such as vegetables, fruits, whole grains, and beans-are healthier than others.   Finally, stay active  Signed, Thomasene Ripple, DO  03/27/2023 11:16 AM    [facility]

## 2023-03-28 LAB — COMPREHENSIVE METABOLIC PANEL
ALT: 19 IU/L (ref 0–32)
AST: 22 IU/L (ref 0–40)
Albumin: 4.6 g/dL (ref 3.8–4.9)
Alkaline Phosphatase: 76 IU/L (ref 44–121)
BUN/Creatinine Ratio: 25 — ABNORMAL HIGH (ref 9–23)
BUN: 17 mg/dL (ref 6–24)
Bilirubin Total: 0.3 mg/dL (ref 0.0–1.2)
CO2: 24 mmol/L (ref 20–29)
Calcium: 9.4 mg/dL (ref 8.7–10.2)
Chloride: 103 mmol/L (ref 96–106)
Creatinine, Ser: 0.69 mg/dL (ref 0.57–1.00)
Globulin, Total: 2.5 g/dL (ref 1.5–4.5)
Glucose: 94 mg/dL (ref 70–99)
Potassium: 5.1 mmol/L (ref 3.5–5.2)
Sodium: 141 mmol/L (ref 134–144)
Total Protein: 7.1 g/dL (ref 6.0–8.5)
eGFR: 101 mL/min/{1.73_m2} (ref >59)

## 2023-03-28 LAB — MAGNESIUM: Magnesium: 2.1 mg/dL (ref 1.6–2.3)

## 2023-03-31 ENCOUNTER — Encounter: Payer: Self-pay | Admitting: Cardiology

## 2023-08-18 ENCOUNTER — Other Ambulatory Visit: Payer: Self-pay | Admitting: Cardiology

## 2023-08-23 ENCOUNTER — Telehealth: Payer: Self-pay | Admitting: Cardiology

## 2023-08-23 DIAGNOSIS — E782 Mixed hyperlipidemia: Secondary | ICD-10-CM

## 2023-08-23 DIAGNOSIS — I2510 Atherosclerotic heart disease of native coronary artery without angina pectoris: Secondary | ICD-10-CM

## 2023-08-23 MED ORDER — REPATHA SURECLICK 140 MG/ML ~~LOC~~ SOAJ: 140.0000 mg | SUBCUTANEOUS | 1 refills | Status: AC

## 2023-08-23 NOTE — Telephone Encounter (Signed)
*  STAT* If patient is at the pharmacy, call can be transferred to refill team.   1. Which medications need to be refilled? (please list name of each medication and dose if known) Evolocumab  (REPATHA  SURECLICK) 140 MG/ML SOAJ   2. Which pharmacy/location (including street and city if local pharmacy) is medication to be sent to?  [id number] - [place], [place] - [address] [facility]    3. Do they need a 30 day or 90 day supply? 90

## 2023-08-25 ENCOUNTER — Other Ambulatory Visit (HOSPITAL_COMMUNITY): Payer: Self-pay

## 2023-08-25 ENCOUNTER — Encounter: Payer: Self-pay | Admitting: Cardiology

## 2023-08-25 DIAGNOSIS — E785 Hyperlipidemia, unspecified: Secondary | ICD-10-CM

## 2023-09-14 MED ORDER — PRAVASTATIN SODIUM 20 MG PO TABS: 20.0000 mg | ORAL_TABLET | Freq: Every evening | ORAL | 3 refills | Status: AC

## 2023-09-26 ENCOUNTER — Encounter: Payer: Self-pay | Admitting: Cardiology

## 2023-10-06 ENCOUNTER — Ambulatory Visit: Payer: Self-pay | Admitting: Pharmacist

## 2023-10-06 LAB — LIPID PANEL
Chol/HDL Ratio: 2.2 ratio (ref 0.0–4.4)
Cholesterol, Total: 142 mg/dL (ref 100–199)
HDL: 66 mg/dL (ref >39)
LDL Chol Calc (NIH): 64 mg/dL (ref 0–99)
Triglycerides: 53 mg/dL (ref 0–149)
VLDL Cholesterol Cal: 12 mg/dL (ref 5–40)

## 2023-11-09 ENCOUNTER — Inpatient Hospital Stay
Admission: RE | Admit: 2023-11-09 | Discharge: 2023-11-09 | Disposition: A | Source: Ambulatory Visit | Attending: Acute Care | Admitting: Acute Care

## 2023-11-09 DIAGNOSIS — Z122 Encounter for screening for malignant neoplasm of respiratory organs: Secondary | ICD-10-CM

## 2023-11-09 DIAGNOSIS — Z87891 Personal history of nicotine dependence: Secondary | ICD-10-CM

## 2023-11-15 ENCOUNTER — Other Ambulatory Visit: Payer: Self-pay | Admitting: Acute Care

## 2023-11-15 DIAGNOSIS — Z87891 Personal history of nicotine dependence: Secondary | ICD-10-CM

## 2023-11-15 DIAGNOSIS — Z122 Encounter for screening for malignant neoplasm of respiratory organs: Secondary | ICD-10-CM

## 2024-01-23 ENCOUNTER — Encounter: Payer: Self-pay | Admitting: Internal Medicine

## 2024-01-23 ENCOUNTER — Ambulatory Visit: Admitting: Internal Medicine

## 2024-01-23 VITALS — BP 90/60 | HR 64 | Temp 98.4°F | Ht 63.0 in | Wt 135.2 lb

## 2024-01-23 DIAGNOSIS — J449 Chronic obstructive pulmonary disease, unspecified: Secondary | ICD-10-CM | POA: Diagnosis not present

## 2024-01-23 DIAGNOSIS — Z87891 Personal history of nicotine dependence: Secondary | ICD-10-CM | POA: Diagnosis not present

## 2024-01-23 DIAGNOSIS — G4733 Obstructive sleep apnea (adult) (pediatric): Secondary | ICD-10-CM

## 2024-01-23 MED ORDER — ALBUTEROL SULFATE HFA 108 (90 BASE) MCG/ACT IN AERS: 1.0000 | INHALATION_SPRAY | Freq: Four times a day (QID) | RESPIRATORY_TRACT | 10 refills | Status: AC | PRN

## 2024-01-23 MED ORDER — IPRATROPIUM-ALBUTEROL 0.5-2.5 (3) MG/3ML IN SOLN: 3.0000 mL | Freq: Four times a day (QID) | RESPIRATORY_TRACT | 12 refills | Status: AC | PRN

## 2024-01-23 NOTE — Patient Instructions (Addendum)
" °  Recommend home sleep test to assess for sleep apnea Continue to use inhalers as needed Exercise as tolerated "

## 2024-01-23 NOTE — Progress Notes (Signed)
 " [facility] Consultation      TESTS Chest imaging: October 2023 low-dose screening CT scan showed upper lobe predominant emphysema, bronchial wall thickening, RADS 1S PFT: 2019 spirometry performed by asthma and allergy: Ratio 67% FEV1 2.2L 83% pred 2023 spirometry normal Ratio 77% FEV1 2.3L (performed by PCP)  Echo: 03/2019 TTE > LVEF 60-65%, RV size and function normal  CHIEF COMPLAINT:   Follow-up assessment for COPD    HISTORY OF PRESENT ILLNESS      Follow-up assessment for COPD Patient uses albuterol  as needed Patient uses DuoNebs as needed Patient refusing long-acting inhaler therapy  No exacerbation at this time No evidence of heart failure at this time No evidence or signs of infection at this time No respiratory distress No fevers, chills, nausea, vomiting, diarrhea No evidence of lower extremity edema No evidence hemoptysis    Assessment of OSA Patient with a previous AHI of 22 Patient has refused CPAP in the past Plan to repeat sleep study and would like to be re-assessed for OSA    01/23/2024   11:00 AM  Results of the Epworth flowsheet  Sitting and reading 1  Watching TV 1  Sitting, inactive in a public place (e.g. a theatre or a meeting) 0  As a passenger in a car for an hour without a break 0  Lying down to rest in the afternoon when circumstances permit 3  Sitting and talking to someone 0  Sitting quietly after a lunch without alcohol 3  In a car, while stopped for a few minutes in traffic 0  Total score 8        PAST MEDICAL HISTORY   Past Medical History:  Diagnosis Date   Abnormal cervical Papanicolaou smear 02/08/2018   Allergic rhinitis    Anxiety    Arthritis    Asthma    Chronic sinusitis    Herniated cervical disc 03/10/2020   Hyperlipidemia    Hyperthyroidism 04/01/2020   IBS (irritable bowel syndrome)    Irritable bowel syndrome 02/08/2018   Palpitations 02/01/2019   Sleep apnea    Syncope and  collapse 02/01/2019   Tobacco use 02/01/2019   Vertigo      SURGICAL HISTORY   Past Surgical History:  Procedure Laterality Date   COLONOSCOPY  01/22/2016   Colon Polyp(s). Internal hemorrhoids   LAPAROSCOPIC HYSTERECTOMY     SINOSCOPY       FAMILY HISTORY   Family History  Problem Relation Age of Onset   Allergic rhinitis Mother    Arthritis Mother    Irritable bowel syndrome Mother    High Cholesterol Father    Colon cancer Father    Stroke Maternal Aunt    Heart attack Maternal Uncle    Stroke Maternal Grandmother    Irritable bowel syndrome Maternal Grandmother    Heart attack Maternal Grandfather    Colon polyps Son    Allergic rhinitis Son    Irritable bowel syndrome Niece    Esophageal cancer Neg Hx    Rectal cancer Neg Hx    Stomach cancer Neg Hx      SOCIAL HISTORY   Social History   Tobacco Use   Smoking status: Former    Current packs/day: 0.00    Average packs/day: 1.0 packs/day    Types: Cigarettes    Quit date: 07/15/2021    Years since quitting: 2.5   Smokeless tobacco: Never  Vaping Use   Vaping status: Every Day   Start date:  01/03/2021   Devices: roni zero  Substance Use Topics   Alcohol use: Yes    Comment: Rare, special occasion   Drug use: Not Currently     MEDICATIONS    Home Medication:  Current Outpatient Rx   Order #: [id number] Class: Normal   Order #: [id number] Class: Historical Med   Order #: [id number] Class: Historical Med   Order #: [id number] Class: Historical Med   Order #: [id number] Class: Historical Med   Order #: [id number] Class: Historical Med   Order #: [id number] Class: Historical Med   Order #: [id number] Class: Normal   Order #: [id number] Class: Historical Med   Order #: [id number] Class: Normal   Order #: [id number] Class: Historical Med   Order #: [id number] Class: Historical Med   Order #: [id number] Class: Historical Med   Order #: [id number] Class: Historical Med   Order #: [id number] Class: Normal   Order #:  [id number] Class: Historical Med   Order #: [id number] Class: Historical Med   Order #: [id number] Class: Historical Med   Order #: [id number] Class: Historical Med   Order #: [id number] Class: Historical Med    Current Medication:  Current Outpatient Medications:    albuterol  (VENTOLIN  HFA) 108 (90 Base) MCG/ACT inhaler, Inhale 1-2 puffs into the lungs every 6 (six) hours as needed for wheezing or shortness of breath., Disp: 18 g, Rfl: 10   amitriptyline (ELAVIL) 25 MG tablet, Take 25 mg by mouth at bedtime., Disp: , Rfl:    atorvastatin  (LIPITOR) 10 MG tablet, Take 10 mg by mouth daily., Disp: , Rfl:    clindamycin (CLEOCIN) 300 MG capsule, Take 300 mg by mouth 3 (three) times daily., Disp: , Rfl:    cyclobenzaprine (FLEXERIL) 5 MG tablet, Take 5 mg by mouth daily as needed., Disp: , Rfl:    dicyclomine (BENTYL) 10 MG capsule, Take 10 mg by mouth 4 (four) times daily as needed., Disp: , Rfl:    estradiol (ESTRACE) 0.01 % CREA vaginal cream, Place 1 Applicatorful vaginally 3 (three) times a week., Disp: , Rfl:    Evolocumab  (REPATHA  SURECLICK) 140 MG/ML SOAJ, Inject 140 mg into the skin every 14 (fourteen) days., Disp: 6 mL, Rfl: 1   fluticasone (FLONASE) 50 MCG/ACT nasal spray, Place 2 sprays into both nostrils daily., Disp: , Rfl:    ipratropium-albuterol  (DUONEB) 0.5-2.5 (3) MG/3ML SOLN, Take 3 mLs by nebulization every 6 (six) hours as needed., Disp: 360 mL, Rfl: 12   loratadine (CLARITIN) 10 MG tablet, Take 10 mg by mouth daily., Disp: , Rfl:    meclizine (ANTIVERT) 12.5 MG tablet, Take 12.5 mg by mouth 2 (two) times daily as needed for dizziness or nausea., Disp: , Rfl:    nitrofurantoin, macrocrystal-monohydrate, (MACROBID) 100 MG capsule, Take 100 mg by mouth 2 (two) times daily., Disp: , Rfl:    nystatin-triamcinolone (MYCOLOG II) cream, Apply 1 Application topically 2 (two) times daily., Disp: , Rfl:    pravastatin  (PRAVACHOL ) 20 MG tablet, Take 1 tablet (20 mg total) by mouth every evening.,  Disp: 90 tablet, Rfl: 3   tirzepatide (ZEPBOUND) 2.5 MG/0.5ML Pen, Inject 2.5 mg into the skin once a week., Disp: , Rfl:    traMADol (ULTRAM) 50 MG tablet, Take 50 mg by mouth every 6 (six) hours as needed for severe pain (pain score 7-10) or moderate pain (pain score 4-6)., Disp: , Rfl:    YUVAFEM 10 MCG TABS vaginal tablet, Place vaginally., Disp: , Rfl:    cyclobenzaprine (FLEXERIL) 10 MG tablet, Take 10 mg by mouth every 8 (eight) hours as needed. (Patient not taking:  Reported on 01/23/2024), Disp: , Rfl:    diclofenac (VOLTAREN) 75 MG EC tablet, Take 75 mg by mouth daily as needed. (Patient not taking: Reported on 01/23/2024), Disp: , Rfl:     ALLERGIES   Atorvastatin  calcium , Rosuvastatin , Atorvastatin , Penicillins, and Anoro ellipta  [umeclidinium-vilanterol]  BP 90/60   Pulse 64   Temp 98.4 F (36.9 C)   Ht 5' 3 (1.6 m)   Wt 135 lb 3.2 oz (61.3 kg)   SpO2 100%   BMI 23.95 kg/m       Physical Examination:  General Appearance: No distress  EYES EOM intact.   NECK Supple, No JVD Pulmonary: normal breath sounds, No wheezing.  CardiovascularNormal S1,S2.  No m/r/g.   Ext pulses intact, cap refill intact  ALL OTHER ROS ARE NEGATIVE     ASSESSMENT/PLAN   59 year old white female seen today for follow-up assessment for noncompliant OSA and COPD  Assessment COPD No significant respiratory compromise at this time No need for maintenance therapy at this time Continue albuterol  as needed   Low-dose CT Independently reviewed in detail today Lung cancer screening program November 2025 CT scan No significant abnormality to suggest malignancy Evidence of emphysema   Avoid Allergens and Irritants Avoid secondhand smoke Avoid SICK contacts Recommend  Masking  when appropriate Recommend Keep up-to-date with vaccinations   Assessment of sleep apnea Recommend home sleep test for definitive diagnosis previous AHI was 22   MEDICATION ADJUSTMENTS/LABS AND TESTS  ORDERED: Albuterol  as needed Home sleep test Avoid Allergens and Irritants Avoid secondhand smoke Avoid SICK contacts Recommend  Masking  when appropriate Recommend Keep up-to-date with vaccinations    CURRENT MEDICATIONS REVIEWED AT LENGTH WITH PATIENT TODAY   Patient  satisfied with Plan of action and management. All questions answered   Follow up 3 months   I spent a total of 43 minutes dedicated to the care of this patient on the date of this encounter to include pre-visit review of records, face-to-face time with the patient discussing conditions above, post visit ordering of testing, clinical documentation with the electronic health record, making appropriate referrals as documented, and communicating necessary information to the patient's healthcare team.    The Patient requires high complexity decision making for assessment and support, frequent evaluation and titration of therapies, application of advanced monitoring technologies and extensive interpretation of multiple databases.  Patient satisfied with Plan of action and management. All questions answered    Nickolas Alm Cellar, M.D.  [facility]  Medical Director [facility]          "

## 2024-01-25 NOTE — Addendum Note (Signed)
 Addended byBETHA ISAIAH LENIS on: 01/25/2024 12:35 PM   Modules accepted: Level of Service

## 2024-03-27 ENCOUNTER — Ambulatory Visit: Admitting: Podiatry

## 2024-03-27 ENCOUNTER — Ambulatory Visit: Admitting: Cardiology
